# Patient Record
Sex: Female | Born: 1955 | Race: White | Hispanic: No | Smoking: Current every day smoker
Health system: Southern US, Community
[De-identification: ages and names within clinical notes are randomized; demographics above are authoritative.]

## PROBLEM LIST (undated history)

## (undated) DIAGNOSIS — S82209A Unspecified fracture of shaft of unspecified tibia, initial encounter for closed fracture: Secondary | ICD-10-CM

## (undated) DIAGNOSIS — J449 Chronic obstructive pulmonary disease, unspecified: Secondary | ICD-10-CM

## (undated) DIAGNOSIS — G5600 Carpal tunnel syndrome, unspecified upper limb: Secondary | ICD-10-CM

## (undated) DIAGNOSIS — J189 Pneumonia, unspecified organism: Secondary | ICD-10-CM

## (undated) DIAGNOSIS — Z8719 Personal history of other diseases of the digestive system: Secondary | ICD-10-CM

## (undated) DIAGNOSIS — M1990 Unspecified osteoarthritis, unspecified site: Secondary | ICD-10-CM

## (undated) DIAGNOSIS — D649 Anemia, unspecified: Secondary | ICD-10-CM

## (undated) DIAGNOSIS — K219 Gastro-esophageal reflux disease without esophagitis: Secondary | ICD-10-CM

## (undated) DIAGNOSIS — G43909 Migraine, unspecified, not intractable, without status migrainosus: Secondary | ICD-10-CM

## (undated) DIAGNOSIS — I10 Essential (primary) hypertension: Secondary | ICD-10-CM

## (undated) HISTORY — PX: UPPER GI ENDOSCOPY: SHX6162

## (undated) HISTORY — PX: ABDOMINAL HYSTERECTOMY: SHX81

## (undated) HISTORY — PX: COLONOSCOPY: SHX174

## (undated) HISTORY — PX: CARPAL TUNNEL RELEASE: SHX101

## (undated) HISTORY — PX: BACK SURGERY: SHX140

## (undated) HISTORY — PX: ORIF TIBIA & FIBULA FRACTURES: SHX2131

---

## 1984-03-05 HISTORY — PX: ECTOPIC PREGNANCY SURGERY: SHX613

## 2019-03-11 ENCOUNTER — Encounter: Payer: Self-pay | Admitting: Orthopaedic Surgery

## 2019-03-11 ENCOUNTER — Telehealth: Payer: Self-pay | Admitting: Orthopaedic Surgery

## 2019-03-11 ENCOUNTER — Ambulatory Visit (INDEPENDENT_AMBULATORY_CARE_PROVIDER_SITE_OTHER): Payer: Medicare HMO

## 2019-03-11 ENCOUNTER — Ambulatory Visit (INDEPENDENT_AMBULATORY_CARE_PROVIDER_SITE_OTHER): Payer: Medicare HMO | Admitting: Orthopaedic Surgery

## 2019-03-11 VITALS — Ht 60.0 in | Wt 118.0 lb

## 2019-03-11 DIAGNOSIS — S82202P Unspecified fracture of shaft of left tibia, subsequent encounter for closed fracture with malunion: Secondary | ICD-10-CM

## 2019-03-11 DIAGNOSIS — S82201P Unspecified fracture of shaft of right tibia, subsequent encounter for closed fracture with malunion: Secondary | ICD-10-CM | POA: Diagnosis not present

## 2019-03-11 DIAGNOSIS — M79661 Pain in right lower leg: Secondary | ICD-10-CM

## 2019-03-11 NOTE — Telephone Encounter (Signed)
I received 2 release forms from pt. they were not completed entirely and one was not dated. I mailed them to patient with highlighted areas that need to be filled in before can process.

## 2019-03-12 DIAGNOSIS — S82201P Unspecified fracture of shaft of right tibia, subsequent encounter for closed fracture with malunion: Secondary | ICD-10-CM | POA: Insufficient documentation

## 2019-03-12 DIAGNOSIS — S82202P Unspecified fracture of shaft of left tibia, subsequent encounter for closed fracture with malunion: Secondary | ICD-10-CM | POA: Insufficient documentation

## 2019-03-12 NOTE — Progress Notes (Signed)
Office Visit Note   Patient: Anne Carrillo           Date of Birth: [birth year]           MRN: [id number] Visit Date: 03/11/2019              Requested by: No referring provider defined for this encounter. PCP: Patient, No Pcp Per   Assessment & Plan: Visit Diagnoses:  1. Pain in right lower leg   2. Bilateral tibial fractures, closed, with malunion, subsequent encounter     Plan: Patient can return in 3 weeks we will need to get x-rays AP and lateral of the tibia and asked Dr. Lorin Mercy specifically about technique to make sure the 2 views are exactly 90degrees difference.  Discussion with patient that recommendation would be a dome osteotomy with fixation either repeat plating or external fixator application.  We discussed problems with plate removal potential for some shortening which he already has.  Questions were elicited and answered.  Repeat visit in 3 weeks for x-rays as above.  Follow-Up Instructions: Return in about 3 weeks (around 04/01/2019).   Orders:  Orders Placed This Encounter  Procedures  . XR Tibia/Fibula Right   No orders of the defined types were placed in this encounter.     Procedures: No procedures performed   Clinical Data: No additional findings.   Subjective: Chief Complaint  Patient presents with  . Right Leg - Pain    HPI 64 year old female has moved to this area has complex problem which is a right tibial nonunion originally fixed by Dr. Alphonzo Lemmings and orthopedist in [place].  She recently had been in [facility] and was treated by Dr. Cleda Clarks PCP.  Patient has a long-leg double upright brace with thigh attachment.  She walks with crutches.  She is supposed to have surgery in [place] at [facility] but states she has moved here.  Originally she stepped on a heating vent and broke her right tibia and surgery was done in [place].  She has been on tramadol in the past also gabapentin 3  times daily she takes losartan for hypertension.  Patient comes without records but states her PCP Dr.Dloomy had her orthopedic records from [place].  Patient currently lives alone after her initial fracture fixation she was in a skilled nursing home.  She states she was amatory at about 6 months after the surgery when she had increased pain and x-rays demonstrated a broken plate.  This appears to be a Synthes 13 hole plate by x-ray.  Review of Systems positive for tibial fracture with broken plate and malunion.  Positive for GERD, hypertension.   Objective: Vital Signs: Ht 5' (1.524 m)   Wt 118 lb (53.5 kg)   BMI 23.05 kg/m   Physical Exam Constitutional:      Appearance: She is well-developed.  HENT:     Head: Normocephalic.     Right Ear: External ear normal.     Left Ear: External ear normal.  Eyes:     Pupils: Pupils are equal, round, and reactive to light.  Neck:     Thyroid: No thyromegaly.     Trachea: No tracheal deviation.  Cardiovascular:     Rate and Rhythm: Normal rate.  Pulmonary:     Effort: Pulmonary effort is normal.  Abdominal:     Palpations: Abdomen is soft.  Skin:    General: Skin is warm and dry.  Neurological:  Mental Status: She is alert and oriented to person, place, and time.  Psychiatric:        Behavior: Behavior normal.     Ortho Exam patient has a right tibial nonunion with 40 degrees of varus in the proximal third of the tibia.  No motion at the previous fracture site.  Distal pulses palpable anterior tib gastrocsoleus is active.  No left tibial deformity.  Specialty Comments:  No specialty comments available.  Imaging: XR Tibia/Fibula Right  Result Date: 03/12/2019 AP lateral bleak x-rays right tibia is obtained.  This shows Synthes 13 hole plate broken in the proximal third with healed tib-fib fracture with 40 degrees of varus and 20 degrees of anterior bowing.  Fibular fracture more proximal is also healed with abundant callus  formation. Impression: Tibial nonunion with broken plate with deformity described above.    PMFS History: Patient Active Problem List   Diagnosis Date Noted  . Bilateral tibial fractures, closed, with malunion, subsequent encounter 03/12/2019   No past medical history on file.  No family history on file.  Past Surgical History:  Procedure Laterality Date  . right lower leg surgery     Social History   Occupational History  . Not on file  Tobacco Use  . Smoking status: Current Every Day Smoker  . Smokeless tobacco: Never Used  Substance and Sexual Activity  . Alcohol use: Not on file  . Drug use: Not on file  . Sexual activity: Not on file

## 2019-04-01 ENCOUNTER — Ambulatory Visit (INDEPENDENT_AMBULATORY_CARE_PROVIDER_SITE_OTHER): Payer: Medicare HMO

## 2019-04-01 ENCOUNTER — Ambulatory Visit: Payer: Medicare HMO | Admitting: Orthopaedic Surgery

## 2019-04-01 ENCOUNTER — Other Ambulatory Visit: Payer: Self-pay

## 2019-04-01 ENCOUNTER — Encounter: Payer: Self-pay | Admitting: Orthopaedic Surgery

## 2019-04-01 VITALS — Ht 60.0 in | Wt 118.0 lb

## 2019-04-01 DIAGNOSIS — T84116S Breakdown (mechanical) of internal fixation device of bone of right lower leg, sequela: Secondary | ICD-10-CM | POA: Diagnosis not present

## 2019-04-01 DIAGNOSIS — M79661 Pain in right lower leg: Secondary | ICD-10-CM

## 2019-04-01 DIAGNOSIS — T84418A Breakdown (mechanical) of other internal orthopedic devices, implants and grafts, initial encounter: Secondary | ICD-10-CM | POA: Insufficient documentation

## 2019-04-01 DIAGNOSIS — S82201P Unspecified fracture of shaft of right tibia, subsequent encounter for closed fracture with malunion: Secondary | ICD-10-CM

## 2019-04-01 DIAGNOSIS — S82202P Unspecified fracture of shaft of left tibia, subsequent encounter for closed fracture with malunion: Secondary | ICD-10-CM | POA: Diagnosis not present

## 2019-04-01 NOTE — Progress Notes (Signed)
Office Visit Note   Patient: Anne Carrillo           Date of Birth: [birth year]           MRN: [id number] Visit Date: 04/01/2019              Requested by: No referring provider defined for this encounter. PCP: Patient, No Pcp Per   Assessment & Plan: Visit Diagnoses:  1. Pain in right lower leg   2. Bilateral tibial fractures, closed, with malunion, subsequent encounter   3. Mechanical breakdown of internal fixation device of bone of right lower leg, sequela             Right tibial malunion with 26 degrees varus and 24 degrees anterior bow deformity.  Plan: Patient has a complex problem preventing her ambulation with a healed tibial malunion with broken 18-hole plate.  This is a lateral tibial plateau plate and she is healed with significant deformity.  She will require removal of the right tibial plateau plate, tibial osteotomy through the malunion.  On 2 views of her knee 90 degrees apart deformity very similar 24 to 26 degrees so she will need a anterolateral osteotomy shaped like a smile with correction of the deformity and repeat plate fixation.  We discussed risks of malunion risk of infection with osteomyelitis, reoperation.  We discussed possibly using double plate fixation for additional stability since she previously walked after for surgery and broke the plate.  Questions elicited and answered.  She lives alone and would need skilled nursing facility postoperatively since she does not have anyone to care for her.  She has hypertension and GERD and needs to find some local primary medical care.  Outlined procedure discussed.  Questions were elicited and answered she understands  And request to proceed.  We discussed recommendations quit smoking to improve her chances of having successful healing.  She is a long-term smoker. Decision for surgery made at todays visit.   Follow-Up Instructions: return in 2 wks . If she gets primary care coverage and is medically cleared for surgery then  surgery will be scheduled.   Orders:  Orders Placed This Encounter  Procedures  . XR Tibia/Fibula Right   No orders of the defined types were placed in this encounter.     Procedures: No procedures performed   Clinical Data: No additional findings.   Subjective: Chief Complaint  Patient presents with  . Right Leg - Pain    HPI 64 year old female returns with right proximal third tib-fib nonunion and broken plate healed and significant varus and anterior bowing.  Original surgery done by Dr. Alphonzo Lemmings and orthopedist in [place].  She had seen another orthopedist in [facility] before she moved to [place] which was Dr. Cleda Clarks who had discussed osteotomy correction of her significant tibial nonunion deformity.  She is walking a long-leg brace has persistent pain problems.  Originally she stepped on a heating vent and broke her right tibia and surgery was done in [place].  She has been on tramadol gabapentin for the pain.  She uses losartan for hypertension but otherwise has been healthy active and ambulates with crutches.  Patient has a Synthes 13 hole plate which broke in the postoperative period and healed with cast formation and significant varus.  Review of Systems positive for GERD, hypertension, right tibial malunion.    Objective: Vital Signs: Ht 5' (1.524 m)   Wt 118 lb (53.5 kg)   BMI 23.05 kg/m  Physical Exam Constitutional:      Appearance: She is well-developed.  HENT:     Head: Normocephalic.     Right Ear: External ear normal.     Left Ear: External ear normal.  Eyes:     Pupils: Pupils are equal, round, and reactive to light.  Neck:     Thyroid: No thyromegaly.     Trachea: No tracheal deviation.  Cardiovascular:     Rate and Rhythm: Normal rate.  Pulmonary:     Effort: Pulmonary effort is normal.  Abdominal:     Palpations: Abdomen is soft.  Skin:    General: Skin is warm and dry.  Neurological:      Mental Status: She is alert and oriented to person, place, and time.  Psychiatric:        Behavior: Behavior normal.     Ortho Exam various anterior bowing noted proximal third of the right tibia with prominent palpable anterior screws.  Skin is intact no areas of drainage pulses palpable she has ankle dorsiflexion plantarflexion and palpable pulses.  Left tibia shows good position alignment.  Specialty Comments:  No specialty comments available.  Imaging: XR Tibia/Fibula Right  Result Date: 04/01/2019 AP and lateral tib-fib x-rays obtained multiple views to get a perfect AP and lateral of the knee.  This shows tib-fib malunion with involvement of the proximal third of the tibial shaft with 26 degrees varus and 24 degrees anterior bowing.  Abundant callus formation and complete healing of the fracture with malunion is noted.  18 hole broken plate is noted. Impression: Right tibial malunion with plate fracture and significant deformity described above.    PMFS History: Patient Active Problem List   Diagnosis Date Noted  . Mechanical breakdown of internal orthopedic device (HCC) 04/01/2019  . Bilateral tibial fractures, closed, with malunion, subsequent encounter 03/12/2019   No past medical history on file.  No family history on file.  Past Surgical History:  Procedure Laterality Date  . right lower leg surgery     Social History   Occupational History  . Not on file  Tobacco Use  . Smoking status: Current Every Day Smoker  . Smokeless tobacco: Never Used  Substance and Sexual Activity  . Alcohol use: Not on file  . Drug use: Not on file  . Sexual activity: Not on file

## 2019-04-01 NOTE — Progress Notes (Deleted)
   Post-Op Visit Note   Patient: Anne Carrillo           Date of Birth: 12-23-1955           MRN: [id number] Visit Date: 04/01/2019 PCP: Patient, No Pcp Per   Assessment & Plan:  Chief Complaint:  Chief Complaint  Patient presents with  . Right Leg - Pain   Visit Diagnoses:  1. Pain in right lower leg     Plan: ***  Follow-Up Instructions: Return in about 2 weeks (around 04/15/2019).   Orders:  Orders Placed This Encounter  Procedures  . XR Tibia/Fibula Right   No orders of the defined types were placed in this encounter.   Imaging: No results found.  PMFS History: Patient Active Problem List   Diagnosis Date Noted  . Bilateral tibial fractures, closed, with malunion, subsequent encounter 03/12/2019   No past medical history on file.  No family history on file.  Past Surgical History:  Procedure Laterality Date  . right lower leg surgery     Social History   Occupational History  . Not on file  Tobacco Use  . Smoking status: Current Every Day Smoker  . Smokeless tobacco: Never Used  Substance and Sexual Activity  . Alcohol use: Not on file  . Drug use: Not on file  . Sexual activity: Not on file

## 2019-05-04 ENCOUNTER — Telehealth: Payer: Self-pay | Admitting: Orthopaedic Surgery

## 2019-05-04 NOTE — Telephone Encounter (Signed)
Patient called requesting Dr. Ophelia Charter contact patient primary Dr. Burton Apley. Phone number [phone]. Patient stated Dr. Su Hilt is waiting for results so patient can be scheduled for surgery. Patient phone number is [phone].

## 2019-05-04 NOTE — Telephone Encounter (Signed)
Were you trying to obtain clearance from her PCP?

## 2019-05-06 ENCOUNTER — Telehealth: Payer: Self-pay | Admitting: Orthopaedic Surgery

## 2019-05-06 NOTE — Telephone Encounter (Signed)
She has HTN needs Tx and once PCP feels she is OK for surgery then we can schedule. Justing letting a PCP lay eyes on her does not fix HTN and GERD etc. thanks

## 2019-05-06 NOTE — Telephone Encounter (Signed)
Patient called requesting a call back from Dr. Ophelia Charter. Patient states her PCP is waiting for a call from Dr. Ophelia Charter about test results ans moving forward with a surgery date. Patient phone number is [phone]

## 2019-05-06 NOTE — Telephone Encounter (Signed)
Per Anne Carrillo, patient has called her with PCP.  [name] does not have blue sheet. Per last office note, you were going to have patient return to the office in two weeks, after she established a PCP, however, surgery was decided upon that day.  Would you like for me to bring patient back in to the office prior to completing blue sheet and [name] reaching out for clearance?

## 2019-05-13 NOTE — Telephone Encounter (Signed)
Have you spoken with patient in regards to surgery and Dr. Ophelia Charter message? I tried to reach her with no answer. If not, please send back to me and I will continue to try and reach her. Thanks.

## 2019-05-18 NOTE — Telephone Encounter (Signed)
I don't have a surgery sheet on this patient.  I can send a clearance to the PCP if I know exactly what procedure she is having.  I saw the note from Dr. Ophelia Charter on 05-06-19.  It seems from his note there are other  health issues that need to be addressed prior to scheduling.

## 2019-05-19 NOTE — Telephone Encounter (Signed)
Yes sounds good

## 2019-07-23 ENCOUNTER — Other Ambulatory Visit: Payer: Self-pay | Admitting: Gastroenterology

## 2019-07-23 DIAGNOSIS — Z789 Other specified health status: Secondary | ICD-10-CM

## 2019-07-23 DIAGNOSIS — Z7289 Other problems related to lifestyle: Secondary | ICD-10-CM

## 2019-07-23 DIAGNOSIS — F1090 Alcohol use, unspecified, uncomplicated: Secondary | ICD-10-CM

## 2019-07-28 ENCOUNTER — Ambulatory Visit
Admission: RE | Admit: 2019-07-28 | Discharge: 2019-07-28 | Disposition: A | Discharge status: Home / Self Care | Payer: Medicare HMO | Source: Ambulatory Visit | Attending: Gastroenterology | Admitting: Gastroenterology

## 2019-07-28 DIAGNOSIS — Z7289 Other problems related to lifestyle: Secondary | ICD-10-CM

## 2019-07-28 DIAGNOSIS — Z789 Other specified health status: Secondary | ICD-10-CM

## 2019-09-14 ENCOUNTER — Telehealth: Payer: Self-pay | Admitting: Orthopaedic Surgery

## 2019-09-14 NOTE — Telephone Encounter (Signed)
See below. Please advise. Thanks.  

## 2019-09-14 NOTE — Telephone Encounter (Signed)
removal of the right tibial plateau plate, tibial osteotomy through the    Patient called wants to move forward with surgery.  Her last visit shows 04-01-19 and instructions to follow up 2 weeks later.  Patient is now established with PCP-Dr. Burton Apley in [place]. Patient said she saw him just a few weeks ago.  She did mention that she has cut back on her smoking.   Last dictation describes plan as removal of the right tibial plateau plate, tibial osteotomy through the malunion.   Can I please have surgery sheet with codes?

## 2019-09-14 NOTE — Telephone Encounter (Signed)
Needs to quit smoking before surgery so maximize chance that leg will heal and be straight. Will need pre-op visit with Zonia Kief several weeks after she quits to be sure she is not smoking . Blue sheet done .

## 2019-09-17 ENCOUNTER — Telehealth: Payer: Self-pay | Admitting: Orthopaedic Surgery

## 2019-09-23 ENCOUNTER — Other Ambulatory Visit: Payer: Self-pay

## 2019-10-07 ENCOUNTER — Ambulatory Visit (INDEPENDENT_AMBULATORY_CARE_PROVIDER_SITE_OTHER): Payer: Medicare HMO | Admitting: Surgery

## 2019-10-07 ENCOUNTER — Encounter: Payer: Self-pay | Admitting: Surgery

## 2019-10-07 VITALS — BP 115/87 | HR 115 | Ht <= 58 in | Wt 133.6 lb

## 2019-10-07 DIAGNOSIS — T84116S Breakdown (mechanical) of internal fixation device of bone of right lower leg, sequela: Secondary | ICD-10-CM

## 2019-10-07 NOTE — Progress Notes (Signed)
64 year old white female with history of right tibial nonunion and hardware failure from previous ORIF comes in for preop evaluation.  Continues to have ongoing issues with her leg.  Patient wants to proceed with right tibia fibula osteotomy, takedown malunion, replating.  Surgical procedure discussed.  Today history and physical performed.  Review of systems negative.  Patient states "I just want to get this thing fixed".  All questions answered.

## 2019-10-10 ENCOUNTER — Other Ambulatory Visit (HOSPITAL_COMMUNITY)
Admission: RE | Admit: 2019-10-10 | Discharge: 2019-10-10 | Disposition: A | Discharge status: Home / Self Care | Payer: Medicare Other | Source: Ambulatory Visit | Attending: Orthopaedic Surgery | Admitting: Orthopaedic Surgery

## 2019-10-10 DIAGNOSIS — Z01812 Encounter for preprocedural laboratory examination: Secondary | ICD-10-CM | POA: Insufficient documentation

## 2019-10-10 DIAGNOSIS — Z20822 Contact with and (suspected) exposure to covid-19: Secondary | ICD-10-CM | POA: Insufficient documentation

## 2019-10-10 LAB — SARS CORONAVIRUS 2 (TAT 6-24 HRS): SARS Coronavirus 2: NEGATIVE

## 2019-10-13 ENCOUNTER — Other Ambulatory Visit: Payer: Self-pay

## 2019-10-13 ENCOUNTER — Encounter (HOSPITAL_COMMUNITY): Payer: Self-pay | Admitting: Orthopaedic Surgery

## 2019-10-13 NOTE — Progress Notes (Addendum)
COVID Vaccine Completed: No Date COVID Vaccine completed:N/A COVID vaccine manufacturer: N/A  PCP - Dr. Burton Apley Cardiologist - N/A  Chest x-ray - N/A EKG - N/A Stress Test - N/A ECHO - N/A Cardiac Cath - N/A  Sleep Study - N/A CPAP - N/A  Fasting Blood Sugar - N/A Checks Blood Sugar _N/A____ times a day  Blood Thinner Instructions: N/A Aspirin Instructions: N/A Last Dose:N/A   Anesthesia review: N/A  Patient denies shortness of breath, fever, cough and chest pain at PAT appointment   Patient verbalized understanding of instructions that were given to them at the PAT appointment. Patient was also instructed that they will need to review over the PAT instructions again at home before surgery.

## 2019-10-13 NOTE — Anesthesia Preprocedure Evaluation (Addendum)
Anesthesia Evaluation  Patient identified by MRN, date of birth, ID band Patient awake    Reviewed: Allergy & Precautions, NPO status , Patient's Chart, lab work & pertinent test results  Airway Mallampati: II  TM Distance: >3 FB Neck ROM: Full    Dental  (+) Dental Advisory Given   Pulmonary pneumonia, COPD, Current Smoker,    Pulmonary exam normal breath sounds clear to auscultation       Cardiovascular hypertension, Pt. on medications Normal cardiovascular exam Rhythm:Regular Rate:Normal     Neuro/Psych  Headaches,    GI/Hepatic Neg liver ROS, hiatal hernia, GERD  ,  Endo/Other  negative endocrine ROS  Renal/GU negative Renal ROS     Musculoskeletal  (+) Arthritis ,   Abdominal   Peds  Hematology  (+) Blood dyscrasia, anemia ,   Anesthesia Other Findings   Reproductive/Obstetrics                            Anesthesia Physical Anesthesia Plan  ASA: III  Anesthesia Plan: General   Post-op Pain Management:  Regional for Post-op pain   Induction: Intravenous  PONV Risk Score and Plan: 2 and Ondansetron, Dexamethasone and Treatment may vary due to age or medical condition  Airway Management Planned: Oral ETT  Additional Equipment:   Intra-op Plan:   Post-operative Plan: Extubation in OR  Informed Consent: I have reviewed the patients History and Physical, chart, labs and discussed the procedure including the risks, benefits and alternatives for the proposed anesthesia with the patient or authorized representative who has indicated his/her understanding and acceptance.     Dental advisory given  Plan Discussed with: CRNA  Anesthesia Plan Comments:       Anesthesia Quick Evaluation

## 2019-10-14 ENCOUNTER — Ambulatory Visit (HOSPITAL_COMMUNITY): Payer: Medicare Other

## 2019-10-14 ENCOUNTER — Ambulatory Visit (HOSPITAL_COMMUNITY): Payer: Medicare Other | Admitting: Certified Registered"

## 2019-10-14 ENCOUNTER — Encounter (HOSPITAL_COMMUNITY)
Admission: AD | Disposition: A | Discharge status: Skilled Nursing Facility | Payer: Self-pay | Source: Home / Self Care | Attending: Orthopaedic Surgery

## 2019-10-14 ENCOUNTER — Encounter (HOSPITAL_COMMUNITY): Payer: Self-pay | Admitting: Orthopaedic Surgery

## 2019-10-14 ENCOUNTER — Inpatient Hospital Stay (HOSPITAL_COMMUNITY)
Admission: AD | Admit: 2019-10-14 | Discharge: 2019-10-19 | DRG: 493 | Disposition: A | Discharge status: Skilled Nursing Facility | Payer: Medicare Other | Attending: Orthopaedic Surgery | Admitting: Orthopaedic Surgery

## 2019-10-14 DIAGNOSIS — F17210 Nicotine dependence, cigarettes, uncomplicated: Secondary | ICD-10-CM | POA: Diagnosis present

## 2019-10-14 DIAGNOSIS — S82201P Unspecified fracture of shaft of right tibia, subsequent encounter for closed fracture with malunion: Secondary | ICD-10-CM

## 2019-10-14 DIAGNOSIS — Y792 Prosthetic and other implants, materials and accessory orthopedic devices associated with adverse incidents: Secondary | ICD-10-CM | POA: Diagnosis present

## 2019-10-14 DIAGNOSIS — T84196A Other mechanical complication of internal fixation device of bone of right lower leg, initial encounter: Principal | ICD-10-CM | POA: Diagnosis present

## 2019-10-14 DIAGNOSIS — J449 Chronic obstructive pulmonary disease, unspecified: Secondary | ICD-10-CM | POA: Diagnosis present

## 2019-10-14 DIAGNOSIS — D62 Acute posthemorrhagic anemia: Secondary | ICD-10-CM | POA: Diagnosis not present

## 2019-10-14 DIAGNOSIS — T84116S Breakdown (mechanical) of internal fixation device of bone of right lower leg, sequela: Secondary | ICD-10-CM | POA: Diagnosis not present

## 2019-10-14 DIAGNOSIS — Z419 Encounter for procedure for purposes other than remedying health state, unspecified: Secondary | ICD-10-CM

## 2019-10-14 DIAGNOSIS — Z20822 Contact with and (suspected) exposure to covid-19: Secondary | ICD-10-CM | POA: Diagnosis present

## 2019-10-14 DIAGNOSIS — S82101P Unspecified fracture of upper end of right tibia, subsequent encounter for closed fracture with malunion: Secondary | ICD-10-CM | POA: Diagnosis not present

## 2019-10-14 DIAGNOSIS — Z90710 Acquired absence of both cervix and uterus: Secondary | ICD-10-CM | POA: Diagnosis not present

## 2019-10-14 DIAGNOSIS — T84418A Breakdown (mechanical) of other internal orthopedic devices, implants and grafts, initial encounter: Secondary | ICD-10-CM | POA: Diagnosis present

## 2019-10-14 DIAGNOSIS — I10 Essential (primary) hypertension: Secondary | ICD-10-CM | POA: Diagnosis present

## 2019-10-14 DIAGNOSIS — K219 Gastro-esophageal reflux disease without esophagitis: Secondary | ICD-10-CM | POA: Diagnosis present

## 2019-10-14 DIAGNOSIS — S82401P Unspecified fracture of shaft of right fibula, subsequent encounter for closed fracture with malunion: Secondary | ICD-10-CM | POA: Diagnosis not present

## 2019-10-14 HISTORY — DX: Pneumonia, unspecified organism: J18.9

## 2019-10-14 HISTORY — DX: Migraine, unspecified, not intractable, without status migrainosus: G43.909

## 2019-10-14 HISTORY — DX: Carpal tunnel syndrome, unspecified upper limb: G56.00

## 2019-10-14 HISTORY — DX: Anemia, unspecified: D64.9

## 2019-10-14 HISTORY — DX: Unspecified fracture of shaft of unspecified tibia, initial encounter for closed fracture: S82.209A

## 2019-10-14 HISTORY — DX: Chronic obstructive pulmonary disease, unspecified: J44.9

## 2019-10-14 HISTORY — DX: Essential (primary) hypertension: I10

## 2019-10-14 HISTORY — DX: Personal history of other diseases of the digestive system: Z87.19

## 2019-10-14 HISTORY — PX: TIBIA OSTEOTOMY: SHX1065

## 2019-10-14 HISTORY — DX: Gastro-esophageal reflux disease without esophagitis: K21.9

## 2019-10-14 HISTORY — DX: Unspecified osteoarthritis, unspecified site: M19.90

## 2019-10-14 LAB — COMPREHENSIVE METABOLIC PANEL
ALT: 11 U/L (ref 0–44)
AST: 18 U/L (ref 15–41)
Albumin: 3.6 g/dL (ref 3.5–5.0)
Alkaline Phosphatase: 70 U/L (ref 38–126)
Anion gap: 8 (ref 5–15)
BUN: 14 mg/dL (ref 8–23)
CO2: 21 mmol/L — ABNORMAL LOW (ref 22–32)
Calcium: 8.5 mg/dL — ABNORMAL LOW (ref 8.9–10.3)
Chloride: 105 mmol/L (ref 98–111)
Creatinine, Ser: 0.89 mg/dL (ref 0.44–1.00)
GFR calc Af Amer: >60 mL/min (ref >60)
GFR calc non Af Amer: >60 mL/min (ref >60)
Glucose, Bld: 81 mg/dL (ref 70–99)
Potassium: 4.1 mmol/L (ref 3.5–5.1)
Sodium: 134 mmol/L — ABNORMAL LOW (ref 135–145)
Total Bilirubin: 0.2 mg/dL — ABNORMAL LOW (ref 0.3–1.2)
Total Protein: 7.1 g/dL (ref 6.5–8.1)

## 2019-10-14 LAB — CBC
HCT: 26.6 % — ABNORMAL LOW (ref 36.0–46.0)
Hemoglobin: 8.1 g/dL — ABNORMAL LOW (ref 12.0–15.0)
MCH: 23.5 pg — ABNORMAL LOW (ref 26.0–34.0)
MCHC: 30.5 g/dL (ref 30.0–36.0)
MCV: 77.3 fL — ABNORMAL LOW (ref 80.0–100.0)
Platelets: 350 10*3/uL (ref 150–400)
RBC: 3.44 MIL/uL — ABNORMAL LOW (ref 3.87–5.11)
RDW: 17.2 % — ABNORMAL HIGH (ref 11.5–15.5)
WBC: 6.6 10*3/uL (ref 4.0–10.5)
nRBC: 0 % (ref 0.0–0.2)

## 2019-10-14 LAB — ABO/RH: ABO/RH(D): A POS

## 2019-10-14 SURGERY — OSTEOTOMY, TIBIA
Anesthesia: General | Site: Leg Lower | Laterality: Right

## 2019-10-14 MED ORDER — GABAPENTIN 300 MG PO CAPS
600.0000 mg | ORAL_CAPSULE | Freq: Three times a day (TID) | ORAL | Status: DC
  Administered 2019-10-14 – 2019-10-19 (×15): 600 mg via ORAL
  Filled 2019-10-14 (×15): qty 2

## 2019-10-14 MED ORDER — MIDAZOLAM HCL 2 MG/2ML IJ SOLN
INTRAMUSCULAR | Status: AC
  Filled 2019-10-14: qty 2

## 2019-10-14 MED ORDER — PHENYLEPHRINE HCL (PRESSORS) 10 MG/ML IV SOLN
INTRAVENOUS | Status: DC | PRN
Start: 2019-10-14 — End: 2019-10-14
  Administered 2019-10-14: 100 ug via INTRAVENOUS
  Administered 2019-10-14 (×2): 120 ug via INTRAVENOUS
  Administered 2019-10-14: 160 ug via INTRAVENOUS
  Administered 2019-10-14 (×2): 80 ug via INTRAVENOUS

## 2019-10-14 MED ORDER — ROCURONIUM BROMIDE 10 MG/ML (PF) SYRINGE
PREFILLED_SYRINGE | INTRAVENOUS | Status: AC
  Filled 2019-10-14: qty 10

## 2019-10-14 MED ORDER — LOSARTAN POTASSIUM 25 MG PO TABS
25.0000 mg | ORAL_TABLET | Freq: Every evening | ORAL | Status: DC
  Administered 2019-10-14 – 2019-10-19 (×4): 25 mg via ORAL
  Filled 2019-10-14 (×6): qty 1

## 2019-10-14 MED ORDER — DEXAMETHASONE SODIUM PHOSPHATE 4 MG/ML IJ SOLN
INTRAMUSCULAR | Status: DC | PRN
  Administered 2019-10-14: 4 mg via PERINEURAL
  Administered 2019-10-14: 3 mg via PERINEURAL
  Administered 2019-10-14: 8 mg via PERINEURAL

## 2019-10-14 MED ORDER — PANTOPRAZOLE SODIUM 40 MG PO TBEC
40.0000 mg | DELAYED_RELEASE_TABLET | Freq: Every day | ORAL | Status: DC
  Administered 2019-10-14 – 2019-10-19 (×6): 40 mg via ORAL
  Filled 2019-10-14 (×5): qty 1

## 2019-10-14 MED ORDER — PHENYLEPHRINE HCL (PRESSORS) 10 MG/ML IV SOLN
INTRAVENOUS | Status: AC
  Filled 2019-10-14: qty 1

## 2019-10-14 MED ORDER — CLONIDINE HCL (ANALGESIA) 100 MCG/ML EP SOLN
EPIDURAL | Status: DC | PRN
  Administered 2019-10-14: 30 ug
  Administered 2019-10-14: 50 ug

## 2019-10-14 MED ORDER — METOCLOPRAMIDE HCL 5 MG/ML IJ SOLN: 5.0000 mg | Freq: Three times a day (TID) | INTRAMUSCULAR | Status: DC | PRN

## 2019-10-14 MED ORDER — CEFAZOLIN SODIUM-DEXTROSE 2-4 GM/100ML-% IV SOLN
2.0000 g | INTRAVENOUS | Status: AC
  Administered 2019-10-14: 2 g via INTRAVENOUS
  Filled 2019-10-14: qty 100

## 2019-10-14 MED ORDER — FENTANYL CITRATE (PF) 100 MCG/2ML IJ SOLN
INTRAMUSCULAR | Status: DC | PRN
  Administered 2019-10-14: 25 ug via INTRAVENOUS
  Administered 2019-10-14: 50 ug via INTRAVENOUS
  Administered 2019-10-14: 25 ug via INTRAVENOUS
  Administered 2019-10-14: 50 ug via INTRAVENOUS
  Administered 2019-10-14 (×4): 25 ug via INTRAVENOUS

## 2019-10-14 MED ORDER — ONDANSETRON HCL 4 MG/2ML IJ SOLN
INTRAMUSCULAR | Status: DC | PRN
  Administered 2019-10-14: 4 mg via INTRAVENOUS

## 2019-10-14 MED ORDER — ALBUMIN HUMAN 5 % IV SOLN: INTRAVENOUS | Status: DC | PRN

## 2019-10-14 MED ORDER — PROPOFOL 10 MG/ML IV BOLUS
INTRAVENOUS | Status: AC
  Filled 2019-10-14: qty 20

## 2019-10-14 MED ORDER — ACETAMINOPHEN 325 MG PO TABS
325.0000 mg | ORAL_TABLET | Freq: Four times a day (QID) | ORAL | Status: DC | PRN
  Administered 2019-10-15 – 2019-10-19 (×6): 650 mg via ORAL
  Filled 2019-10-14 (×6): qty 2

## 2019-10-14 MED ORDER — PHENYLEPHRINE 40 MCG/ML (10ML) SYRINGE FOR IV PUSH (FOR BLOOD PRESSURE SUPPORT)
PREFILLED_SYRINGE | INTRAVENOUS | Status: AC
  Filled 2019-10-14: qty 10

## 2019-10-14 MED ORDER — 0.9 % SODIUM CHLORIDE (POUR BTL) OPTIME
TOPICAL | Status: DC | PRN
  Administered 2019-10-14: 1000 mL

## 2019-10-14 MED ORDER — EPHEDRINE 5 MG/ML INJ
INTRAVENOUS | Status: AC
  Filled 2019-10-14: qty 10

## 2019-10-14 MED ORDER — OXYCODONE HCL 5 MG PO TABS
5.0000 mg | ORAL_TABLET | ORAL | Status: DC | PRN
  Administered 2019-10-14 – 2019-10-19 (×23): 5 mg via ORAL
  Filled 2019-10-14 (×23): qty 1

## 2019-10-14 MED ORDER — PHENYLEPHRINE HCL-NACL 10-0.9 MG/250ML-% IV SOLN
INTRAVENOUS | Status: DC | PRN
  Administered 2019-10-14: 15 ug/min via INTRAVENOUS

## 2019-10-14 MED ORDER — LIDOCAINE 2% (20 MG/ML) 5 ML SYRINGE
INTRAMUSCULAR | Status: DC | PRN
  Administered 2019-10-14: 80 mg via INTRAVENOUS

## 2019-10-14 MED ORDER — PROPOFOL 10 MG/ML IV BOLUS
INTRAVENOUS | Status: DC | PRN
  Administered 2019-10-14: 25 mg via INTRAVENOUS
  Administered 2019-10-14: 70 mg via INTRAVENOUS

## 2019-10-14 MED ORDER — MIDAZOLAM HCL 2 MG/2ML IJ SOLN
1.0000 mg | INTRAMUSCULAR | Status: DC
  Administered 2019-10-14: 2 mg via INTRAVENOUS
  Filled 2019-10-14: qty 2

## 2019-10-14 MED ORDER — LIDOCAINE 2% (20 MG/ML) 5 ML SYRINGE
INTRAMUSCULAR | Status: AC
  Filled 2019-10-14: qty 5

## 2019-10-14 MED ORDER — PHENYLEPHRINE 40 MCG/ML (10ML) SYRINGE FOR IV PUSH (FOR BLOOD PRESSURE SUPPORT)
PREFILLED_SYRINGE | INTRAVENOUS | Status: DC | PRN
  Administered 2019-10-14: 120 ug via INTRAVENOUS
  Administered 2019-10-14: 80 ug via INTRAVENOUS
  Administered 2019-10-14: 120 ug via INTRAVENOUS
  Administered 2019-10-14: 80 ug via INTRAVENOUS

## 2019-10-14 MED ORDER — ONDANSETRON HCL 4 MG PO TABS
4.0000 mg | ORAL_TABLET | Freq: Four times a day (QID) | ORAL | Status: DC | PRN
  Administered 2019-10-16 – 2019-10-17 (×2): 4 mg via ORAL
  Filled 2019-10-14 (×2): qty 1

## 2019-10-14 MED ORDER — ASPIRIN 325 MG PO TABS
325.0000 mg | ORAL_TABLET | Freq: Every day | ORAL | Status: DC
  Administered 2019-10-15 – 2019-10-19 (×5): 325 mg via ORAL
  Filled 2019-10-14 (×5): qty 1

## 2019-10-14 MED ORDER — ROCURONIUM BROMIDE 10 MG/ML (PF) SYRINGE
PREFILLED_SYRINGE | INTRAVENOUS | Status: DC | PRN
  Administered 2019-10-14: 40 mg via INTRAVENOUS
  Administered 2019-10-14 (×3): 10 mg via INTRAVENOUS

## 2019-10-14 MED ORDER — HYDROMORPHONE HCL 1 MG/ML IJ SOLN
0.5000 mg | INTRAMUSCULAR | Status: DC | PRN
  Administered 2019-10-16 – 2019-10-19 (×7): 0.5 mg via INTRAVENOUS
  Filled 2019-10-14 (×7): qty 0.5

## 2019-10-14 MED ORDER — BUPIVACAINE-EPINEPHRINE (PF) 0.5% -1:200000 IJ SOLN
INTRAMUSCULAR | Status: DC | PRN
  Administered 2019-10-14: 20 mL via PERINEURAL
  Administered 2019-10-14: 30 mL via PERINEURAL

## 2019-10-14 MED ORDER — HYDROMORPHONE HCL 1 MG/ML IJ SOLN: 0.2500 mg | INTRAMUSCULAR | Status: DC | PRN

## 2019-10-14 MED ORDER — POLYETHYLENE GLYCOL 3350 17 G PO PACK
17.0000 g | PACK | Freq: Every day | ORAL | Status: DC | PRN
  Administered 2019-10-17: 17 g via ORAL
  Filled 2019-10-14: qty 1

## 2019-10-14 MED ORDER — DOCUSATE SODIUM 100 MG PO CAPS
100.0000 mg | ORAL_CAPSULE | Freq: Two times a day (BID) | ORAL | Status: DC
  Administered 2019-10-14 – 2019-10-19 (×10): 100 mg via ORAL
  Filled 2019-10-14 (×10): qty 1

## 2019-10-14 MED ORDER — FENTANYL CITRATE (PF) 100 MCG/2ML IJ SOLN
50.0000 ug | Freq: Once | INTRAMUSCULAR | Status: AC
  Administered 2019-10-14: 50 ug via INTRAVENOUS
  Filled 2019-10-14: qty 2

## 2019-10-14 MED ORDER — FENTANYL CITRATE (PF) 250 MCG/5ML IJ SOLN
INTRAMUSCULAR | Status: AC
  Filled 2019-10-14: qty 5

## 2019-10-14 MED ORDER — DEXAMETHASONE SODIUM PHOSPHATE 10 MG/ML IJ SOLN
INTRAMUSCULAR | Status: AC
  Filled 2019-10-14: qty 1

## 2019-10-14 MED ORDER — SUGAMMADEX SODIUM 200 MG/2ML IV SOLN
INTRAVENOUS | Status: DC | PRN
  Administered 2019-10-14: 200 mg via INTRAVENOUS

## 2019-10-14 MED ORDER — MEPERIDINE HCL 50 MG/ML IJ SOLN: 6.2500 mg | INTRAMUSCULAR | Status: DC | PRN

## 2019-10-14 MED ORDER — METOCLOPRAMIDE HCL 5 MG PO TABS: 5.0000 mg | ORAL_TABLET | Freq: Three times a day (TID) | ORAL | Status: DC | PRN

## 2019-10-14 MED ORDER — LACTATED RINGERS IV SOLN: INTRAVENOUS | Status: DC

## 2019-10-14 MED ORDER — ONDANSETRON HCL 4 MG/2ML IJ SOLN: 4.0000 mg | Freq: Once | INTRAMUSCULAR | Status: DC | PRN

## 2019-10-14 MED ORDER — ONDANSETRON HCL 4 MG/2ML IJ SOLN: 4.0000 mg | Freq: Four times a day (QID) | INTRAMUSCULAR | Status: DC | PRN

## 2019-10-14 MED ORDER — ONDANSETRON HCL 4 MG/2ML IJ SOLN
INTRAMUSCULAR | Status: AC
  Filled 2019-10-14: qty 2

## 2019-10-14 MED ORDER — SODIUM CHLORIDE 0.9 % IV SOLN: INTRAVENOUS | Status: DC

## 2019-10-14 MED ORDER — MIDAZOLAM HCL 5 MG/5ML IJ SOLN
INTRAMUSCULAR | Status: DC | PRN
  Administered 2019-10-14: 1 mg via INTRAVENOUS

## 2019-10-14 SURGICAL SUPPLY — 58 items
BAG SPEC THK2 15X12 ZIP CLS (MISCELLANEOUS) ×1
BAG ZIPLOCK 12X15 (MISCELLANEOUS) ×2 IMPLANT
BANDAGE ESMARK 6X9 LF (GAUZE/BANDAGES/DRESSINGS) ×1 IMPLANT
BIT DRILL 2.5X2.75 QC CALB (BIT) ×2 IMPLANT
BIT DRILL CALIBRATED 2.7 (BIT) ×2 IMPLANT
BIT DRILL QC 3.3X195 (BIT) ×2 IMPLANT
BNDG CMPR 9X6 STRL LF SNTH (GAUZE/BANDAGES/DRESSINGS) ×1
BNDG ELASTIC 4X5.8 VLCR STR LF (GAUZE/BANDAGES/DRESSINGS) ×2 IMPLANT
BNDG ELASTIC 6X5.8 VLCR STR LF (GAUZE/BANDAGES/DRESSINGS) ×2 IMPLANT
BNDG ESMARK 6X9 LF (GAUZE/BANDAGES/DRESSINGS) ×2
CAP LOCK NCB (Cap) ×22 IMPLANT
COVER SURGICAL LIGHT HANDLE (MISCELLANEOUS) ×2 IMPLANT
COVER WAND RF STERILE (DRAPES) IMPLANT
CUFF TOURN SGL QUICK 34 (TOURNIQUET CUFF) ×2
CUFF TRNQT CYL 34X4.125X (TOURNIQUET CUFF) ×1 IMPLANT
DECANTER SPIKE VIAL GLASS SM (MISCELLANEOUS) IMPLANT
DRAPE C-ARM 42X120 X-RAY (DRAPES) ×2 IMPLANT
DRAPE C-ARMOR (DRAPES) ×2 IMPLANT
DRAPE EXTREMITY T 121X128X90 (DISPOSABLE) ×2 IMPLANT
DRAPE U-SHAPE 47X51 STRL (DRAPES) ×2 IMPLANT
DRSG ADAPTIC 3X8 NADH LF (GAUZE/BANDAGES/DRESSINGS) ×2 IMPLANT
DRSG PAD ABDOMINAL 8X10 ST (GAUZE/BANDAGES/DRESSINGS) ×2 IMPLANT
DURAPREP 26ML APPLICATOR (WOUND CARE) ×6 IMPLANT
ELECT REM PT RETURN 15FT ADLT (MISCELLANEOUS) ×2 IMPLANT
GAUZE SPONGE 4X4 12PLY STRL (GAUZE/BANDAGES/DRESSINGS) ×2 IMPLANT
GAUZE XEROFORM 5X9 LF (GAUZE/BANDAGES/DRESSINGS) ×2 IMPLANT
GLOVE BIOGEL PI IND STRL 8 (GLOVE) ×2 IMPLANT
GLOVE BIOGEL PI INDICATOR 8 (GLOVE) ×2
GLOVE ORTHO TXT STRL SZ7.5 (GLOVE) ×4 IMPLANT
GOWN STRL REUS W/TWL LRG LVL3 (GOWN DISPOSABLE) ×2 IMPLANT
IMMOBILIZER KNEE 20 (SOFTGOODS) ×2
IMMOBILIZER KNEE 20 THIGH 36 (SOFTGOODS) ×1 IMPLANT
K-WIRE 2.0 (WIRE) ×4
K-WIRE FXSTD 280X2XNS SS (WIRE) ×2
KIT TURNOVER KIT A (KITS) IMPLANT
KWIRE FXSTD 280X2XNS SS (WIRE) ×2 IMPLANT
NEEDLE HYPO 22GX1.5 SAFETY (NEEDLE) ×2 IMPLANT
PACK TOTAL JOINT (CUSTOM PROCEDURE TRAY) ×2 IMPLANT
PAD CAST 4YDX4 CTTN HI CHSV (CAST SUPPLIES) ×1 IMPLANT
PADDING CAST COTTON 4X4 STRL (CAST SUPPLIES) ×2
PADDING CAST COTTON 6X4 STRL (CAST SUPPLIES) ×2 IMPLANT
PENCIL SMOKE EVACUATOR (MISCELLANEOUS) IMPLANT
PLATE NCB LAT PROX 3H TIBIA 7H (Plate) ×2 IMPLANT
PROTECTOR NERVE ULNAR (MISCELLANEOUS) ×2 IMPLANT
SCREW HUM NCB PA ST 4X60 (Screw) ×4 IMPLANT
SCREW NCB 4.0MX30M (Screw) ×4 IMPLANT
SCREW NCB 4.0MX34M (Screw) ×4 IMPLANT
SCREW NCB 4.0MX65M (Screw) ×4 IMPLANT
SCREW NCB 4.0X26MM (Screw) ×2 IMPLANT
SCREW NCB 4.0X40MM (Screw) ×2 IMPLANT
SCREW NCB 4.0X75 CORT S/T (Screw) ×2 IMPLANT
STAPLER VISISTAT 35W (STAPLE) ×2 IMPLANT
SUT ETHILON 4 0 PS 2 18 (SUTURE) ×4 IMPLANT
SUT VIC AB 0 CT1 36 (SUTURE) ×2 IMPLANT
SUT VIC AB 2-0 CT1 27 (SUTURE) ×2
SUT VIC AB 2-0 CT1 TAPERPNT 27 (SUTURE) ×1 IMPLANT
SYR CONTROL 10ML LL (SYRINGE) ×2 IMPLANT
TOWEL OR 17X26 10 PK STRL BLUE (TOWEL DISPOSABLE) ×2 IMPLANT

## 2019-10-14 NOTE — Anesthesia Procedure Notes (Signed)
Procedure Name: Intubation Date/Time: 10/14/2019 1:03 PM Performed by: Silas Sacramento, CRNA Pre-anesthesia Checklist: Patient identified, Emergency Drugs available, Suction available and Patient being monitored Patient Re-evaluated:Patient Re-evaluated prior to induction Oxygen Delivery Method: Circle system utilized Preoxygenation: Pre-oxygenation with 100% oxygen Induction Type: IV induction Ventilation: Mask ventilation without difficulty and Oral airway inserted - appropriate to patient size Laryngoscope Size: Mac and 3 Grade View: Grade I Tube type: Oral Tube size: 6.5 mm Number of attempts: 1 Placement Confirmation: ETT inserted through vocal cords under direct vision,  positive ETCO2 and breath sounds checked- equal and bilateral Secured at: 22 cm Tube secured with: Tape Dental Injury: Teeth and Oropharynx as per pre-operative assessment

## 2019-10-14 NOTE — Progress Notes (Signed)
AssistedDr. Renold Don with right, ultrasound guided, adductor canal and popliteal block. Side rails up, monitors on throughout procedure. See vital signs in flow sheet. Tolerated Procedure well.

## 2019-10-14 NOTE — H&P (Signed)
Anne Carrillo is an 64 y.o. female.   Chief Complaint: right leg pain and deformity HPI: 64 year old white female with history of right tibial nonunion and hardware failure from previous ORIF comes in for preop evaluation.  Continues to have ongoing issues with her leg.  Patient wants to proceed with right tibia fibula osteotomy, takedown malunion, replating.  Past Medical History:  Diagnosis Date  . Anemia   . Arthritis   . Carpal tunnel syndrome    Bilateral  . COPD (chronic obstructive pulmonary disease) (HCC)   . GERD (gastroesophageal reflux disease)   . History of hiatal hernia   . Hypertension   . Migraines   . Pneumonia   . Tibia/fibula fracture    Right    Past Surgical History:  Procedure Laterality Date  . ABDOMINAL HYSTERECTOMY    . BACK SURGERY     Screws and plates lower back  . CARPAL TUNNEL RELEASE Right   . COLONOSCOPY    . ECTOPIC PREGNANCY SURGERY  1986  . ORIF TIBIA & FIBULA FRACTURES Right   . UPPER GI ENDOSCOPY      History reviewed. No pertinent family history. Social History:  reports that she has been smoking cigarettes. She has a 12.50 pack-year smoking history. She has never used smokeless tobacco. She reports current alcohol use. She reports that she does not use drugs.  Allergies: No Known Allergies  Medications Prior to Admission  Medication Sig Dispense Refill  . cyclobenzaprine (FLEXERIL) 5 MG tablet Take 5 mg by mouth 3 (three) times daily as needed for muscle spasms.    Marland Kitchen gabapentin (NEURONTIN) 600 MG tablet Take 600 mg by mouth 3 (three) times daily.    Marland Kitchen losartan (COZAAR) 25 MG tablet Take 25 mg by mouth every evening.     . pantoprazole (PROTONIX) 40 MG tablet Take 40 mg by mouth daily.    . traMADol (ULTRAM) 50 MG tablet Take 50 mg by mouth every 6 (six) hours as needed for moderate pain.       Results for orders placed or performed during the hospital encounter of 10/14/19 (from the past 48 hour(s))  CBC     Status: Abnormal    Collection Time: 10/14/19 10:20 AM  Result Value Ref Range   WBC 6.6 4.0 - 10.5 K/uL   RBC 3.44 (L) 3.87 - 5.11 MIL/uL   Hemoglobin 8.1 (L) 12.0 - 15.0 g/dL    Comment: Reticulocyte Hemoglobin testing may be clinically indicated, consider ordering this additional test [id number]    HCT 26.6 (L) 36 - 46 %   MCV 77.3 (L) 80.0 - 100.0 fL   MCH 23.5 (L) 26.0 - 34.0 pg   MCHC 30.5 30.0 - 36.0 g/dL   RDW 10.2 (H) 72.5 - 36.6 %   Platelets 350 150 - 400 K/uL   nRBC 0.0 0.0 - 0.2 %    Comment: Performed at [facility], [address] [place], [place] [address]  Comprehensive metabolic panel     Status: Abnormal   Collection Time: 10/14/19 10:20 AM  Result Value Ref Range   Sodium 134 (L) 135 - 145 mmol/L   Potassium 4.1 3.5 - 5.1 mmol/L   Chloride 105 98 - 111 mmol/L   CO2 21 (L) 22 - 32 mmol/L   Glucose, Bld 81 70 - 99 mg/dL    Comment: Glucose reference range applies only to samples taken after fasting for at least 8 hours.   BUN 14 8 - 23  mg/dL   Creatinine, Ser 2.29 0.44 - 1.00 mg/dL   Calcium 8.5 (L) 8.9 - 10.3 mg/dL   Total Protein 7.1 6.5 - 8.1 g/dL   Albumin 3.6 3.5 - 5.0 g/dL   AST 18 15 - 41 U/L   ALT 11 0 - 44 U/L   Alkaline Phosphatase 70 38 - 126 U/L   Total Bilirubin 0.2 (L) 0.3 - 1.2 mg/dL   GFR calc non Af Amer >60 >60 mL/min   GFR calc Af Amer >60 >60 mL/min   Anion gap 8 5 - 15    Comment: Performed at [facility], [address], [place], [place] [address]   No results found.  Review of Systems  Constitutional: Positive for activity change.  HENT: Negative.   Respiratory: Negative.   Cardiovascular: Negative.   Gastrointestinal: Negative.   Musculoskeletal: Positive for gait problem.  Skin: Negative.   Psychiatric/Behavioral: Negative.     Blood pressure 117/81, pulse 85, temperature 98 F (36.7 C), temperature source Oral, resp. rate 16, height 4\' 8"  (1.422 m), weight 59 kg, SpO2 95 %. Physical Exam HENT:      Head: Normocephalic and atraumatic.  Eyes:     Extraocular Movements: Extraocular movements intact.     Pupils: Pupils are equal, round, and reactive to light.  Cardiovascular:     Rate and Rhythm: Regular rhythm.  Pulmonary:     Effort: No respiratory distress.     Breath sounds: Normal breath sounds.  Abdominal:     General: Bowel sounds are normal. There is no distension.  Musculoskeletal:        General: Tenderness present.     Cervical back: Normal range of motion.  Neurological:     Mental Status: She is alert and oriented to person, place, and time.  Psychiatric:        Mood and Affect: Mood normal.      Assessment/Plan right tibial nonunion and hardware failure   We will proceed with surgery as scheduled.  Surgical procedure discussed in detail.  All questions answered.  , PA-C 10/14/2019, 12:02 PM

## 2019-10-14 NOTE — Anesthesia Procedure Notes (Signed)
Anesthesia Regional Block: Adductor canal block   Pre-Anesthetic Checklist: ,, timeout performed, Correct Patient, Correct Site, Correct Laterality, Correct Procedure, Correct Position, site marked, Risks and benefits discussed,  Surgical consent,  Pre-op evaluation,  At surgeon's request and post-op pain management  Laterality: Right  Prep: chloraprep       Needles:  Injection technique: Single-shot  Needle Type: Stimiplex     Needle Length: 9cm  Needle Gauge: 21     Additional Needles:   Procedures:,,,, ultrasound used (permanent image in chart),,,,  Narrative:  Start time: 10/14/2019 12:01 PM End time: 10/14/2019 12:06 PM Injection made incrementally with aspirations every 5 mL.  Performed by: Personally  Anesthesiologist: Lewie Loron, MD  Additional Notes: BP cuff, EKG monitors applied. Sedation begun. Artery and nerve location verified with U/S and anesthetic injected incrementally, slowly, and after negative aspirations under direct u/s guidance. Good fascial /perineural spread. Tolerated well.

## 2019-10-14 NOTE — Transfer of Care (Signed)
Immediate Anesthesia Transfer of Care Note  Patient: Anne Carrillo  Procedure(s) Performed: right tibia fibula osteotomy, takedown malunion, replating (Right Leg Lower)  Patient Location: PACU  Anesthesia Type:GA combined with regional for post-op pain  Level of Consciousness: awake, oriented, drowsy, patient cooperative and responds to stimulation  Airway & Oxygen Therapy: Patient Spontanous Breathing and Patient connected to face mask oxygen  Post-op Assessment: Report given to RN and Post -op Vital signs reviewed and stable  Post vital signs: Reviewed and stable  Last Vitals:  Vitals Value Taken Time  BP 104/68 10/14/19 1600  Temp    Pulse 86 10/14/19 1602  Resp 14 10/14/19 1602  SpO2 98 % 10/14/19 1602  Vitals shown include unvalidated device data.  Last Pain:  Vitals:   10/14/19 1030  TempSrc: Oral  PainSc:       Patients Stated Pain Goal: 3 (10/13/19 1500)  Complications: No complications documented.

## 2019-10-14 NOTE — Anesthesia Procedure Notes (Signed)
Anesthesia Regional Block: Popliteal block   Pre-Anesthetic Checklist: ,, timeout performed, Correct Patient, Correct Site, Correct Laterality, Correct Procedure, Correct Position, site marked, Risks and benefits discussed,  Surgical consent,  Pre-op evaluation,  At surgeon's request and post-op pain management  Laterality: Right  Prep: chloraprep       Needles:  Injection technique: Single-shot  Needle Type: Stimiplex     Needle Length: 10cm  Needle Gauge: 21     Additional Needles:   Procedures:,,,, ultrasound used (permanent image in chart),,,,  Motor weakness within 5 minutes.   Nerve Stimulator or Paresthesia:  Response: 0.5 mA,   Additional Responses:   Narrative:  Start time: 10/14/2019 12:07 PM End time: 10/14/2019 12:13 PM Injection made incrementally with aspirations every 5 mL.  Performed by: Personally  Anesthesiologist: Lewie Loron, MD  Additional Notes: Nerve located and needle positioned with direct ultrasound guidance. Good perineural spread. Patient tolerated well.

## 2019-10-14 NOTE — Op Note (Addendum)
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  Postop diagnosis same  Procedure: #1 removal of broken right Synthes Brand lateral tibial plateau compression plate.  2.  Tibial dome osteotomy with fibular osteotomy and locking lateral tibial 7-hole plate Zimmer recon titanium with locking caps.  Surgeon: Annell Greening, MD  Assistant: Zonia Kief, PA-C medically necessary and present for the entire procedure.  Anesthesia: Preoperative block plus general anesthesia  Tourniquet: Approximately 1.5 hours see anesthetic record x300 mm pressure.  Brief history 64 year old female with surgery originally by Berdine Dance in [place].  She was ambulatory after compression plate fixation of the tibial shaft fracture and broke the plate that healed with a varus deformity of 26 and posterior angulation of 24.  She had moved to [facility] and has been in a double upright long-leg brace for ambulation.  She been scheduled for surgery in [facility] and the surgery was canceled due to Covid last year.  She has a broken plate not able ambulate and is having increased ankle problems.  Procedure: After induction of general anesthesia preoperative block proximal thigh tourniquet DuraPrep using bone foam preoperative Ancef timeout procedure old incision was marked with a sterile skin marker and open.  Patient had a long Synthes plate with 3 proximal screws.  One oblique screw and 9 distal screws.  All screws were removed using power.  Approximately 1 hour was spent chipping bone away and prior to removal of the plate using preoperative radiographs had a been drawn moving up from the screw hole where there was a break above the neck screw and below 2 screws up a :-) osteotomy was drawn on the anterolateral cortex.  Once this was marked all hardware was removed and then multiple drill holes were drilled using a small drill following the :-) outline for a  dome osteotomy.Cuts were completed with cartilage osteotome.  Patient considerable posterior medial bone had to be divided.  Next separate incision was made and fibular osteotomy was done distal to the dome osteotomy on the tibia.  Once this divided mobilization stripping some the periosteum above and below for mobilization the deformity was corrected shifting the distal cortex laterally as well as bringing it anteriorly checking under C arm.  Zimmer recon Biomet plate was selected held with pins proximally also distally.  We had to put the plate on and off multiple times to resect more bone using osteotome to get a nice flat bed along the lateral cortex so the plate would sit flat.  Once this was performed distal aspect of the plate was held with another K wire and proximal screws were placed.  Total of 6 screws were placed proximal to the osteotomy.  5 screws were placed distally and there was good correction of both AP and lateral.  Screw caps were applied after all screws were hand tightened.  Compartments were soft tourniquet was deflated prior to closure.  Periosteum and fascia was pulled over the top of the plate.  Skin staple closure after reapproximate subcutaneous tissue and skin staple closure postop soft dressing and knee immobilizer was applied patient tolerated procedure well and was transferred recovery room in stable condition.

## 2019-10-14 NOTE — Interval H&P Note (Signed)
History and Physical Interval Note:  10/14/2019 12:31 PM  Anne Carrillo  has presented today for surgery, with the diagnosis of right tib/fib malunion, broken plate.  The various methods of treatment have been discussed with the patient and family. After consideration of risks, benefits and other options for treatment, the patient has consented to  Procedure(s): right tibia fibula osteotomy, takedown malunion, replating (Right) as a surgical intervention.  The patient's history has been reviewed, patient examined, no change in status, stable for surgery.  I have reviewed the patient's chart and labs.  Questions were answered to the patient's satisfaction.     Eldred Manges

## 2019-10-15 ENCOUNTER — Encounter (HOSPITAL_COMMUNITY): Payer: Self-pay | Admitting: Orthopaedic Surgery

## 2019-10-15 LAB — CBC
HCT: 20.9 % — ABNORMAL LOW (ref 36.0–46.0)
Hemoglobin: 6.4 g/dL — CL (ref 12.0–15.0)
MCH: 23.6 pg — ABNORMAL LOW (ref 26.0–34.0)
MCHC: 30.6 g/dL (ref 30.0–36.0)
MCV: 77.1 fL — ABNORMAL LOW (ref 80.0–100.0)
Platelets: 287 10*3/uL (ref 150–400)
RBC: 2.71 MIL/uL — ABNORMAL LOW (ref 3.87–5.11)
RDW: 16.8 % — ABNORMAL HIGH (ref 11.5–15.5)
WBC: 7.3 10*3/uL (ref 4.0–10.5)
nRBC: 0 % (ref 0.0–0.2)

## 2019-10-15 LAB — BASIC METABOLIC PANEL
Anion gap: 6 (ref 5–15)
BUN: 17 mg/dL (ref 8–23)
CO2: 20 mmol/L — ABNORMAL LOW (ref 22–32)
Calcium: 8 mg/dL — ABNORMAL LOW (ref 8.9–10.3)
Chloride: 105 mmol/L (ref 98–111)
Creatinine, Ser: 0.8 mg/dL (ref 0.44–1.00)
GFR calc Af Amer: >60 mL/min (ref >60)
GFR calc non Af Amer: >60 mL/min (ref >60)
Glucose, Bld: 136 mg/dL — ABNORMAL HIGH (ref 70–99)
Potassium: 4.2 mmol/L (ref 3.5–5.1)
Sodium: 131 mmol/L — ABNORMAL LOW (ref 135–145)

## 2019-10-15 LAB — PREPARE RBC (CROSSMATCH)

## 2019-10-15 MED ORDER — SODIUM CHLORIDE 0.9% IV SOLUTION: Freq: Once | INTRAVENOUS | Status: AC

## 2019-10-15 NOTE — Evaluation (Signed)
Physical Therapy Evaluation Patient Details Name: Anne Carrillo MRN: [id number] DOB: May 27, 1955 Today's Date: 10/15/2019   History of Present Illness  64 YO female with history of HTN and COPD, s/p R tibia fibula osteotomy, takedown malunion, and replating on 10/14/19.  Clinical Impression  Pt admitted with above diagnosis. Pt demonstrates good upper body strength, requiring only min A to assist RLE over to EOB for supine to sit. Pt able to power up to standing with min A while maintaining RLE NWB. PT demonstrates proper gait pattern maintaining RLE NWB with RW, but pt laughs and says "yeah, I'm not trying to fall" and declines to attempt. Pt lacks RLE sensation from knee down and unable to dorsiflex or plantarflex ankle or wiggle toes. Pt motivated to regain strength and independence, family close by, but unable to ambulate and has 5 steps to enter home so recommending SNF for strengthening and gait training. Pt currently with functional limitations due to the deficits listed below (see PT Problem List). Pt will benefit from skilled PT to increase their independence and safety with mobility to allow discharge to the venue listed below.       Follow Up Recommendations SNF    Equipment Recommendations  3in1 (PT)    Recommendations for Other Services       Precautions / Restrictions Precautions Precautions: Fall Restrictions Weight Bearing Restrictions: Yes RLE Weight Bearing: Non weight bearing      Mobility  Bed Mobility Overal bed mobility: Needs Assistance Bed Mobility: Supine to Sit  Supine to sit: Min assist  General bed mobility comments: min A to to mobilize RLE to EOB, good strength with LLE and able to upright trunk independently with use of bedrail  Transfers Overall transfer level: Needs assistance   Transfers: Sit to/from Stand;Stand Pivot Transfers Sit to Stand: Min assist Stand pivot transfers: Min assist  General transfer comment: respects RLE NWB precautions  well, able to use BUE to power up and min A from therapist, min A to steady pt with pivoting, tolerates standing for ~2 minutes x2 reps before requiring seated rest break  Ambulation/Gait  General Gait Details: unable to attempt steps, pt fearful of falling with RLE NWB  Stairs            Wheelchair Mobility    Modified Rankin (Stroke Patients Only)       Balance Overall balance assessment: Needs assistance Sitting-balance support: Feet supported;No upper extremity supported (L foot only) Sitting balance-Leahy Scale: Good Sitting balance - Comments: seated EOB   Standing balance support: During functional activity;Bilateral upper extremity supported Standing balance-Leahy Scale: Poor Standing balance comment: reliant on RW            Pertinent Vitals/Pain Pain Assessment: No/denies pain ("I can't feel from my knee down")    Home Living Family/patient expects to be discharged to:: Private residence Living Arrangements: Alone (cousin across street) Available Help at Discharge: Family;Available 24 hours/day Type of Home: Mobile home Home Access: Stairs to enter Entrance Stairs-Rails: Right Entrance Stairs-Number of Steps: 5 Home Layout: One level Home Equipment: Walker - 2 wheels;Wheelchair - manual;Cane - single point;Crutches      Prior Function Level of Independence: Needs assistance   Gait / Transfers Assistance Needed: pt reports holding onto furniture when ambulating around home, uses motorized scooter at grocery and uses axillary crutches to ambulate from house to vehicle  ADL's / Homemaking Assistance Needed: pt reports independent with ADLs  Comments: Pt reports falls randomly when RLE buckles and pulls  self up on furniture or calls family to assist off floor. Pt family provides transportation.     Hand Dominance   Dominant Hand: Right    Extremity/Trunk Assessment   Upper Extremity Assessment Upper Extremity Assessment: Overall WFL for tasks  assessed    Lower Extremity Assessment Lower Extremity Assessment: RLE deficits/detail (LLE 4/5 strength) RLE Deficits / Details: knee immobilizer on knee, ankle 0/5, able to perform SLR RLE Sensation: decreased light touch (RLE still numb from knee down)    Cervical / Trunk Assessment Cervical / Trunk Assessment: Normal  Communication   Communication: No difficulties  Cognition Arousal/Alertness: Awake/alert Behavior During Therapy: WFL for tasks assessed/performed Overall Cognitive Status: Within Functional Limits for tasks assessed             General Comments      Exercises     Assessment/Plan    PT Assessment Patient needs continued PT services  PT Problem List Decreased strength;Decreased range of motion;Decreased activity tolerance;Decreased balance;Decreased mobility;Pain       PT Treatment Interventions DME instruction;Gait training;Functional mobility training;Therapeutic activities;Therapeutic exercise;Balance training;Cognitive remediation;Patient/family education    PT Goals (Current goals can be found in the Care Plan section)  Acute Rehab PT Goals Patient Stated Goal: get some rehab then go home PT Goal Formulation: With patient Time For Goal Achievement: 10/29/19 Potential to Achieve Goals: Good    Frequency Min 3X/week   Barriers to discharge        Co-evaluation               AM-PAC PT "6 Clicks" Mobility  Outcome Measure Help needed turning from your back to your side while in a flat bed without using bedrails?: A Little Help needed moving from lying on your back to sitting on the side of a flat bed without using bedrails?: A Little Help needed moving to and from a bed to a chair (including a wheelchair)?: A Little Help needed standing up from a chair using your arms (e.g., wheelchair or bedside chair)?: A Little Help needed to walk in hospital room?: Total Help needed climbing 3-5 steps with a railing? : Total 6 Click Score: 14     End of Session Equipment Utilized During Treatment: Gait belt;Right knee immobilizer Activity Tolerance: Patient tolerated treatment well Patient left: in chair;with call bell/phone within reach;with chair alarm set Nurse Communication: Mobility status PT Visit Diagnosis: Unsteadiness on feet (R26.81);Other abnormalities of gait and mobility (R26.89);Muscle weakness (generalized) (M62.81);Pain Pain - Right/Left: Right Pain - part of body: Leg    Time: 2025-4270 PT Time Calculation (min) (ACUTE ONLY): 23 min   Charges:   PT Evaluation $PT Eval Low Complexity: 1 Low PT Treatments $Therapeutic Activity: 8-22 mins         Tori Naol Ontiveros PT, DPT 10/15/19, 2:11 PM

## 2019-10-15 NOTE — Progress Notes (Signed)
   Subjective: 1 Day Post-Op Procedure(s) (LRB): right tibia fibula osteotomy, takedown malunion, replating (Right) Patient reports pain as 0 on 0-10 scale.    Objective: Vital signs in last 24 hours: Temp:  [97.4 F (36.3 C)-98.5 F (36.9 C)] 97.6 F (36.4 C) (08/12 0514) Pulse Rate:  [79-96] 94 (08/12 0514) Resp:  [10-20] 20 (08/12 0514) BP: (83-126)/(52-81) 119/74 (08/12 0514) SpO2:  [92 %-100 %] 100 % (08/12 0514) Weight:  [59 kg] 59 kg (08/11 1712)  Intake/Output from previous day: 08/11 0701 - 08/12 0700 In: 3244.1 [P.O.:240; I.V.:2389.1; Blood:265; IV Piggyback:350] Out: 2540 [Urine:2500; Blood:40] Intake/Output this shift: No intake/output data recorded.  Recent Labs    10/14/19 1020 10/15/19 0256  HGB 8.1* 6.4*   Recent Labs    10/14/19 1020 10/15/19 0256  WBC 6.6 7.3  RBC 3.44* 2.71*  HCT 26.6* 20.9*  PLT 350 287   Recent Labs    10/14/19 1020 10/15/19 0256  NA 134* 131*  K 4.1 4.2  CL 105 105  CO2 21* 20*  BUN 14 17  CREATININE 0.89 0.80  GLUCOSE 81 136*  CALCIUM 8.5* 8.0*   No results for input(s): LABPT, INR in the last 72 hours.  block still working no motor /foot numb .   Anterior compartment soft DG Tibia/Fibula Right  Result Date: 10/14/2019 CLINICAL DATA:  Elective surgery EXAM: RIGHT TIBIA AND FIBULA - 2 VIEW COMPARISON:  04/01/2019 FINDINGS: Revision of the plate and screw fixation device within the right tibia with improved alignment across the old healed proximal right tibial fracture. No hardware complicating feature. IMPRESSION: As above. Electronically Signed   By: Charlett Nose M.D.   On: 10/14/2019 15:53   DG C-Arm 1-60 Min-No Report  Result Date: 10/14/2019 Fluoroscopy was utilized by the requesting physician.  No radiographic interpretation.    Assessment/Plan: 1 Day Post-Op Procedure(s) (LRB): right tibia fibula osteotomy, takedown malunion, replating (Right) Plan:  Came in with Hgb 8.1. got one unit PRBC's.  Anne Carrillo 10/15/2019, 7:20 AM

## 2019-10-15 NOTE — Plan of Care (Signed)
  Problem: Education: Goal: Knowledge of General Education information will improve Description: Including pain rating scale, medication(s)/side effects and non-pharmacologic comfort measures Outcome: Progressing   Problem: Pain Managment: Goal: General experience of comfort will improve Outcome: Progressing   

## 2019-10-15 NOTE — TOC Initial Note (Signed)
Transition of Care [facility]) - Initial/Assessment Note    Patient Details  Name: Anne Carrillo MRN: [id number] Date of Birth: [birth year]  [facility]) CM/SW Contact:    Lia Hopping, [place] Phone Number: 10/15/2019, 10:02 AM  Clinical Narrative:                 CSW met with the patient at bedside and discuss physician plan for her to go SNF. Patient lives alone and will not be able to care for herself.Patient reports her spouse passed away a few years ago and her son and cousin have been very supportive in her care. Patient report prior to surgery she used a WC, RW and crutches. Patient reports she went to a rehab facility in [place] a few years ago. Patient is agreeable to SNF placement [facility] where she currently resides. CSW explain SNF and [facility] [employer] authorization process. Patient report understanding. CSW will follow up with bed offers.   FL2 to be completed.   Expected Discharge Plan: Skilled Nursing Facility Barriers to Discharge: Insurance Authorization, Continued Medical Work up   Patient Goals and CMS Choice Patient states their goals for this hospitalization and ongoing recovery are:: go to rehab CMS Medicare.gov Compare Post Acute Care list provided to:: Patient Choice offered to / list presented to : Patient  Expected Discharge Plan and Services Expected Discharge Plan: [place] In-house Referral: Clinical Social Work Discharge Planning Services: CM Consult Post Acute Care Choice: [place] arrangements for the past 2 months: Mobile Home                 DME Arranged: N/A DME Agency: NA       HH Arranged: NA [place] Agency: NA        Prior Living Arrangements/Services Living arrangements for the past 2 months: Mobile Home Lives with:: Self Patient language and need for interpreter reviewed:: No Do you feel safe going back to the place where you live?: Yes      Need for Family Participation in  Patient Care: Yes (Comment) Care giver support system in place?: Yes (comment)   Criminal Activity/Legal Involvement Pertinent to Current Situation/Hospitalization: No - Comment as needed  Activities of Daily Living Home Assistive Devices/Equipment: None ADL Screening (condition at time of admission) Patient's cognitive ability adequate to safely complete daily activities?: Yes Is the patient deaf or have difficulty hearing?: No Does the patient have difficulty seeing, even when wearing glasses/contacts?: No Does the patient have difficulty concentrating, remembering, or making decisions?: No Patient able to express need for assistance with ADLs?: Yes Does the patient have difficulty dressing or bathing?: No Independently performs ADLs?: Yes (appropriate for developmental age) Does the patient have difficulty walking or climbing stairs?: Yes Weakness of Legs: Right Weakness of Arms/Hands: None  Permission Sought/Granted   Permission granted to share information with : Yes, Verbal Permission Granted     Permission granted to share info w AGENCY: SNF's in the [facility] area        Emotional Assessment Appearance:: Appears stated age Attitude/Demeanor/Rapport: Engaged Affect (typically observed): Accepting, Pleasant Orientation: : Oriented to Self, Oriented to Place, Oriented to  Time, Oriented to Situation Alcohol / Substance Use: Not Applicable Psych Involvement: No (comment)  Admission diagnosis:  Tibia/fibula fracture, right, closed, with malunion, subsequent encounter [S82.201P, S82.401P] Patient Active Problem List   Diagnosis Date Noted  . Tibia/fibula fracture, right, closed, with malunion, subsequent encounter 10/14/2019  . Mechanical breakdown of internal orthopedic  device ([place]) 04/01/2019   PCP:  Lorene Dy, MD Pharmacy:   [facility] [address] [place] [address] Phone:  [phone] Fax: [phone]     Social Determinants of Health (SDOH) Interventions    Readmission Risk Interventions No flowsheet data found.

## 2019-10-15 NOTE — NC FL2 (Signed)
[place] MEDICAID FL2 LEVEL OF CARE SCREENING TOOL     IDENTIFICATION  Patient Name: Anne Carrillo Birthdate: 1955/07/06 Sex: female Admission Date (Current Location): 10/14/2019  [facility] and [place] Number:  Producer, television/film/video and Address:  [facility],  [address] [place], [place] [address]      Provider Number: [id number]  Attending Physician Name and Address:  Eldred Manges, MD  Relative Name and Phone Number:        Current Level of Care: Hospital Recommended Level of Care: Skilled Nursing Facility Prior Approval Number:    Date Approved/Denied:   PASRR Number: [id number]  Discharge Plan: SNF    Current Diagnoses: Patient Active Problem List   Diagnosis Date Noted  . Tibia/fibula fracture, right, closed, with malunion, subsequent encounter 10/14/2019  . Mechanical breakdown of internal orthopedic device ([facility]) 04/01/2019    Orientation RESPIRATION BLADDER Height & Weight     Self, Time, Situation, Place  Normal Continent Weight: 129 lb 15.7 oz (59 kg) Height:  4\' 8"  (142.2 cm)  BEHAVIORAL SYMPTOMS/MOOD NEUROLOGICAL BOWEL NUTRITION STATUS      Continent Diet (Regular Diet)  AMBULATORY STATUS COMMUNICATION OF NEEDS Skin   Extensive Assist Verbally Surgical wounds                       Personal Care Assistance Level of Assistance  Bathing, Feeding, Dressing Bathing Assistance: Limited assistance Feeding assistance: Independent Dressing Assistance: Maximum assistance     Functional Limitations Info  Sight, Hearing, Speech Sight Info: Adequate Hearing Info: Adequate Speech Info: Adequate    SPECIAL CARE FACTORS FREQUENCY  PT (By licensed PT), OT (By licensed OT)     PT Frequency: 5x/week OT Frequency: 5x/week            Contractures Contractures Info: Not present    Additional Factors Info  Code Status, Allergies, Psychotropic Code Status Info: Fullcode Allergies Info: Allergies: No Known Allergies           Current  Medications (10/15/2019):  This is the current hospital active medication list Current Facility-Administered Medications  Medication Dose Route Frequency Provider Last Rate Last Admin  . 0.9 %  sodium chloride infusion   Intravenous Continuous 12/15/2019, PA-C 85 mL/hr at 10/15/19 0600 Rate Verify at 10/15/19 0600  . acetaminophen (TYLENOL) tablet 325-650 mg  325-650 mg Oral Q6H PRN 12/15/19, PA-C   650 mg at 10/15/19 0859  . aspirin tablet 325 mg  325 mg Oral Daily 12/15/19, PA-C   325 mg at 10/15/19 0859  . docusate sodium (COLACE) capsule 100 mg  100 mg Oral BID 12/15/19, PA-C   100 mg at 10/15/19 0859  . gabapentin (NEURONTIN) capsule 600 mg  600 mg Oral TID 12/15/19, PA-C   600 mg at 10/15/19 0859  . HYDROmorphone (DILAUDID) injection 0.5 mg  0.5 mg Intravenous Q4H PRN 12/15/19 M, PA-C      . losartan (COZAAR) tablet 25 mg  25 mg Oral QPM M, PA-C   25 mg at 10/14/19 1746  . metoCLOPramide (REGLAN) tablet 5-10 mg  5-10 mg Oral Q8H PRN 12/14/19, PA-C       Or  . metoCLOPramide (REGLAN) injection 5-10 mg  5-10 mg Intravenous Q8H PRN 7-10 M, PA-C      . ondansetron [facility]) tablet 4 mg  4 mg Oral Q6H PRN [facility], PA-C  Or  . ondansetron (ZOFRAN) injection 4 mg  4 mg Intravenous Q6H PRN Naida Sleight, PA-C      . oxyCODONE (Oxy IR/ROXICODONE) immediate release tablet 5 mg  5 mg Oral Q4H PRN Naida Sleight, PA-C   5 mg at 10/15/19 0859  . pantoprazole (PROTONIX) EC tablet 40 mg  40 mg Oral Daily Zonia Kief M, PA-C   40 mg at 10/15/19 0900  . polyethylene glycol (MIRALAX / GLYCOLAX) packet 17 g  17 g Oral Daily PRN Naida Sleight, PA-C         Discharge Medications: Please see discharge summary for a list of discharge medications.  Relevant Imaging Results:  Relevant Lab Results:   Additional Information ssn: [id number]  Clearance Coots, LCSW

## 2019-10-15 NOTE — Anesthesia Postprocedure Evaluation (Signed)
Anesthesia Post Note  Patient: Charlean Carneal  Procedure(s) Performed: right tibia fibula osteotomy, takedown malunion, replating (Right Leg Lower)     Patient location during evaluation: PACU Anesthesia Type: General Level of consciousness: sedated and patient cooperative Pain management: pain level controlled Vital Signs Assessment: post-procedure vital signs reviewed and stable Respiratory status: spontaneous breathing Cardiovascular status: stable Anesthetic complications: no   No complications documented.  Last Vitals:  Vitals:   10/15/19 0441 10/15/19 0514  BP: 92/65 119/74  Pulse: 96 94  Resp: 20 20  Temp: (!) 36.3 C 36.4 C  SpO2: 100% 100%    Last Pain:  Vitals:   10/15/19 0514  TempSrc: Oral  PainSc:                  Lewie Loron

## 2019-10-15 NOTE — Plan of Care (Signed)

## 2019-10-15 NOTE — Progress Notes (Signed)
CRITICAL VALUE ALERT  Critical Value: Hemoglobin: 6.4  Date & Time Notied: October 15, 2019 at 3:15 am  Provider Notified: Dr. Doneen Poisson notified at 3:23 am on 10/15/19.  Dr. Doneen Poisson returned call at 3:45 am on 10/15/19.   Orders Received/Actions taken: Per Dr. Doneen Poisson: Transfuse 1 unit of blood.   1 unit of blood started at 4:55 am on 10/15/19.

## 2019-10-16 LAB — TYPE AND SCREEN
ABO/RH(D): A POS
Antibody Screen: NEGATIVE
Unit division: 0

## 2019-10-16 LAB — CBC
HCT: 23.5 % — ABNORMAL LOW (ref 36.0–46.0)
Hemoglobin: 7.1 g/dL — ABNORMAL LOW (ref 12.0–15.0)
MCH: 24.1 pg — ABNORMAL LOW (ref 26.0–34.0)
MCHC: 30.2 g/dL (ref 30.0–36.0)
MCV: 79.9 fL — ABNORMAL LOW (ref 80.0–100.0)
Platelets: 275 10*3/uL (ref 150–400)
RBC: 2.94 MIL/uL — ABNORMAL LOW (ref 3.87–5.11)
RDW: 17.6 % — ABNORMAL HIGH (ref 11.5–15.5)
WBC: 9.6 10*3/uL (ref 4.0–10.5)
nRBC: 0 % (ref 0.0–0.2)

## 2019-10-16 LAB — BPAM RBC
Blood Product Expiration Date: 202109022359
ISSUE DATE / TIME: 202108120447
Unit Type and Rh: 6200

## 2019-10-16 MED ORDER — OXYCODONE-ACETAMINOPHEN 5-325 MG PO TABS: 1.0000 | ORAL_TABLET | ORAL | 0 refills | Status: DC | PRN

## 2019-10-16 MED ORDER — ASPIRIN 325 MG PO TABS: 325.0000 mg | ORAL_TABLET | Freq: Every day | ORAL | 0 refills | Status: DC

## 2019-10-16 MED ORDER — METHOCARBAMOL 500 MG PO TABS: 500.0000 mg | ORAL_TABLET | Freq: Four times a day (QID) | ORAL | 0 refills | Status: DC | PRN

## 2019-10-16 MED ORDER — NICOTINE 14 MG/24HR TD PT24
14.0000 mg | MEDICATED_PATCH | Freq: Every day | TRANSDERMAL | Status: DC
  Administered 2019-10-16 – 2019-10-19 (×4): 14 mg via TRANSDERMAL
  Filled 2019-10-16 (×4): qty 1

## 2019-10-16 NOTE — TOC Progression Note (Addendum)
Transition of Care [facility]) - Progression Note    Patient Details  Name: Anne Carrillo MRN: [id number] Date of Birth: 01/21/1956  Transition of Care [facility]) CM/SW Contact  Clearance Coots, LCSW Phone Number: 10/16/2019, 5:31 PM  Clinical Narrative:    Patient has requested additional time to choose a SNF. CSW explain to patient will need to choose a SNF in order to complete the auth. process and transfer her to SNF.Patient report understanding. TOC weekend staff will follow up with patient.  [employer] medicare auth. Still pending.   Nurse notified the patient will need a covid test prior to discharge.   Expected Discharge Plan: Skilled Nursing Facility Barriers to Discharge: English as a second language teacher, Continued Medical Work up  Expected Discharge Plan and Services Expected Discharge Plan: Skilled Nursing Facility In-house Referral: Clinical Social Work Discharge Planning Services: [employer] Consult Post Acute Care Choice: Skilled Nursing Facility Living arrangements for the past 2 months: Mobile Home                 DME Arranged: N/A DME Agency: NA       HH Arranged: NA HH Agency: NA         Social Determinants of Health (SDOH) Interventions    Readmission Risk Interventions No flowsheet data found.

## 2019-10-16 NOTE — Plan of Care (Signed)
  Problem: Education: Goal: Required Educational Video(s) Outcome: Progressing   Problem: Clinical Measurements: Goal: Ability to maintain clinical measurements within normal limits will improve Outcome: Progressing Goal: Postoperative complications will be avoided or minimized Outcome: Progressing   Problem: Skin Integrity: Goal: Demonstration of wound healing without infection will improve Outcome: Progressing   Problem: Education: Goal: Knowledge of General Education information will improve Description: Including pain rating scale, medication(s)/side effects and non-pharmacologic comfort measures Outcome: Progressing   Problem: Health Behavior/Discharge Planning: Goal: Ability to manage health-related needs will improve Outcome: Progressing   Problem: Clinical Measurements: Goal: Ability to maintain clinical measurements within normal limits will improve Outcome: Progressing Goal: Will remain free from infection Outcome: Progressing Goal: Diagnostic test results will improve Outcome: Progressing Goal: Respiratory complications will improve Outcome: Progressing Goal: Cardiovascular complication will be avoided Outcome: Progressing   Problem: Activity: Goal: Risk for activity intolerance will decrease Outcome: Progressing   Problem: Nutrition: Goal: Adequate nutrition will be maintained Outcome: Progressing   Problem: Coping: Goal: Level of anxiety will decrease Outcome: Progressing   Problem: Elimination: Goal: Will not experience complications related to bowel motility Outcome: Progressing   Problem: Elimination: Goal: Will not experience complications related to bowel motility Outcome: Progressing Goal: Will not experience complications related to urinary retention Outcome: Progressing   Problem: Pain Managment: Goal: General experience of comfort will improve Outcome: Progressing   Problem: Safety: Goal: Ability to remain free from injury will  improve Outcome: Progressing   Problem: Skin Integrity: Goal: Risk for impaired skin integrity will decrease Outcome: Progressing

## 2019-10-16 NOTE — Discharge Instructions (Addendum)
Do not remove dressing or get wet.  Strict non-weightbearing right leg until further notice by Dr Ophelia Charter.    Elevate foot above heart level as much as possible to decrease swelling and pain.    Ok sponge bath.  No tub soaking.  Do not apply any creams or ointments to incision.  RN to perform daily dressing changes.    Contact our office if any questions.

## 2019-10-16 NOTE — Care Management Important Message (Signed)
Important Message  Patient Details IM Letter given to the Patient Name: Anne Carrillo MRN: [id number] Date of Birth: 09/19/1955   Medicare Important Message Given:  Yes     [name] 10/16/2019, 9:01 AM

## 2019-10-16 NOTE — TOC Progression Note (Addendum)
Transition of Care [facility]) - Progression Note    Patient Details  Name: Anne Carrillo MRN: [id number] Date of Birth: 1956/01/11  Transition of Care [facility]) CM/SW Contact  Clearance Coots, LCSW Phone Number: 10/16/2019, 9:24 AM  Clinical Narrative:    CSW initiated  [facility] medicare authorization for SNF placement. Pending Auth. Ref# [id number]. CSW provided the patient with list of bed offers. Patient is not familiar with [facility] area and requested to discuss options with her family.   TOC Staff will continue to follow this patient.     Expected Discharge Plan: Skilled Nursing Facility Barriers to Discharge: English as a second language teacher, Continued Medical Work up  Expected Discharge Plan and Services Expected Discharge Plan: Skilled Nursing Facility In-house Referral: Clinical Social Work Discharge Planning Services: [employer] Consult Post Acute Care Choice: Skilled Nursing Facility Living arrangements for the past 2 months: Mobile Home                 DME Arranged: N/A DME Agency: NA       HH Arranged: NA HH Agency: NA         Social Determinants of Health (SDOH) Interventions    Readmission Risk Interventions No flowsheet data found.

## 2019-10-16 NOTE — Discharge Summary (Addendum)
Patient ID: Anne Carrillo MRN: [id number] DOB/AGE: [birth year] 65 y.o.  Admit date: 10/14/2019 Discharge date: 10/19/19  Admission Diagnoses:  Active Problems:   Mechanical breakdown of internal orthopedic device (HCC)   Tibia/fibula fracture, right, closed, with malunion, subsequent encounter   Discharge Diagnoses:  Active Problems:   Mechanical breakdown of internal orthopedic device (HCC)   Tibia/fibula fracture, right, closed, with malunion, subsequent encounter  status post Procedure(s): right tibia fibula osteotomy, takedown malunion, replating Acute blood loss anemia from surgery with transfusion  Past Medical History:  Diagnosis Date  . Anemia   . Arthritis   . Carpal tunnel syndrome    Bilateral  . COPD (chronic obstructive pulmonary disease) (HCC)   . GERD (gastroesophageal reflux disease)   . History of hiatal hernia   . Hypertension   . Migraines   . Pneumonia   . Tibia/fibula fracture    Right    Surgeries: Procedure(s): right tibia fibula osteotomy, takedown malunion, replating on 10/14/2019   Consultants:   Discharged Condition: Improved  Hospital Course: Anne Carrillo is an 64 y.o. female who was admitted 10/14/2019 for operative treatment of right tibia malunion and hardware failure. Patient failed conservative treatments (please see the history and physical for the specifics) and had severe unremitting pain that affects sleep, daily activities and work/hobbies. After pre-op clearance, the patient was taken to the operating room on 10/14/2019 and underwent  Procedure(s): right tibia fibula osteotomy, takedown malunion, replating.    Patient was given perioperative antibiotics:  Anti-infectives (From admission, onward)   Start     Dose/Rate Route Frequency Ordered Stop   10/14/19 1000  ceFAZolin (ANCEF) IVPB 2g/100 mL premix        2 g 200 mL/hr over 30 Minutes Intravenous On call to O.R. 10/14/19 5188 10/14/19 1319       Patient was given  sequential compression devices and early ambulation to prevent DVT.   Patient benefited maximally from hospital stay and there were no complications. At the time of discharge, the patient was urinating/moving their bowels without difficulty, tolerating a regular diet, pain is controlled with oral pain medications and they have been cleared by PT/OT.   Recent vital signs:  Patient Vitals for the past 24 hrs:  BP Temp Temp src Pulse Resp SpO2  10/16/19 0524 104/89 98.5 F (36.9 C) Oral 96 16 98 %  10/15/19 2114 107/70 98.6 F (37 C) Oral (!) 101 16 96 %  10/15/19 1827 117/80 98.3 F (36.8 C) Axillary 91 17 100 %  10/15/19 1456 91/60 98.4 F (36.9 C) Oral 97 14 97 %     Recent laboratory studies:  Recent Labs    10/14/19 1020 10/14/19 1020 10/15/19 0256 10/16/19 0313  WBC 6.6   < > 7.3 9.6  HGB 8.1*   < > 6.4* 7.1*  HCT 26.6*   < > 20.9* 23.5*  PLT 350   < > 287 275  NA 134*  --  131*  --   K 4.1  --  4.2  --   CL 105  --  105  --   CO2 21*  --  20*  --   BUN 14  --  17  --   CREATININE 0.89  --  0.80  --   GLUCOSE 81  --  136*  --   CALCIUM 8.5*   < > 8.0*  --    < > = values in this interval not displayed.     Discharge  Medications:   Allergies as of 10/16/2019   No Known Allergies     Medication List    STOP taking these medications   cyclobenzaprine 5 MG tablet Commonly known as: FLEXERIL   traMADol 50 MG tablet Commonly known as: ULTRAM     TAKE these medications   aspirin 325 MG tablet Take 1 tablet (325 mg total) by mouth daily. Start taking on: October 17, 2019   gabapentin 600 MG tablet Commonly known as: NEURONTIN Take 600 mg by mouth 3 (three) times daily.   losartan 25 MG tablet Commonly known as: COZAAR Take 25 mg by mouth every evening.   methocarbamol 500 MG tablet Commonly known as: Robaxin Take 1 tablet (500 mg total) by mouth every 6 (six) hours as needed for muscle spasms.   oxyCODONE-acetaminophen 5-325 MG tablet Commonly known  as: Percocet Take 1 tablet by mouth every 4 (four) hours as needed for severe pain.   pantoprazole 40 MG tablet Commonly known as: PROTONIX Take 40 mg by mouth daily.       Diagnostic Studies: DG Tibia/Fibula Right  Result Date: 10/14/2019 CLINICAL DATA:  Elective surgery EXAM: RIGHT TIBIA AND FIBULA - 2 VIEW COMPARISON:  04/01/2019 FINDINGS: Revision of the plate and screw fixation device within the right tibia with improved alignment across the old healed proximal right tibial fracture. No hardware complicating feature. IMPRESSION: As above. Electronically Signed   By: Charlett Nose M.D.   On: 10/14/2019 15:53   DG C-Arm 1-60 Min-No Report  Result Date: 10/14/2019 Fluoroscopy was utilized by the requesting physician.  No radiographic interpretation.        Discharge Plan:  discharge to snf  Disposition:     Signed: Zonia Kief for Annell Greening MD 10/16/2019, 11:08 AM   Addendum:  Pt had BM . Continued therapy transfers without WB on extremity. Ready for SNF. Hgb 7.1

## 2019-10-16 NOTE — Progress Notes (Signed)
Subjective: Doing well.  Pain controlled.  Waiting for snf placement   Objective: Vital signs in last 24 hours: Temp:  [98.3 F (36.8 C)-98.6 F (37 C)] 98.5 F (36.9 C) (08/13 0524) Pulse Rate:  [91-101] 96 (08/13 0524) Resp:  [14-17] 16 (08/13 0524) BP: (91-117)/(60-89) 104/89 (08/13 0524) SpO2:  [96 %-100 %] 98 % (08/13 0524)  Intake/Output from previous day: 08/12 0701 - 08/13 0700 In: 1983 [P.O.:840; I.V.:873; Blood:270] Out: 1900 [Urine:1900] Intake/Output this shift: Total I/O In: 918.9 [P.O.:240; I.V.:678.9] Out: 300 [Urine:300]  Recent Labs    10/14/19 1020 10/15/19 0256 10/16/19 0313  HGB 8.1* 6.4* 7.1*   Recent Labs    10/15/19 0256 10/16/19 0313  WBC 7.3 9.6  RBC 2.71* 2.94*  HCT 20.9* 23.5*  PLT 287 275   Recent Labs    10/14/19 1020 10/15/19 0256  NA 134* 131*  K 4.1 4.2  CL 105 105  CO2 21* 20*  BUN 14 17  CREATININE 0.89 0.80  GLUCOSE 81 136*  CALCIUM 8.5* 8.0*   No results for input(s): LABPT, INR in the last 72 hours.  Exam: Pleasant female, alert and oriented. NAD.  Dressing changed.  Wound looks good. No drainage or signs of infection.  Calf nontender.  Compartments soft.  Moves toes well.  Neurovascularly intact.      Assessment/Plan: Continue present care.  Transfer to snf when bed available.  Patient requests nicotine patch.      Zonia Kief 10/16/2019, 11:50 AM

## 2019-10-17 MED ORDER — SENNOSIDES-DOCUSATE SODIUM 8.6-50 MG PO TABS
1.0000 | ORAL_TABLET | Freq: Two times a day (BID) | ORAL | Status: DC
  Administered 2019-10-17 – 2019-10-19 (×5): 1 via ORAL
  Filled 2019-10-17 (×5): qty 1

## 2019-10-17 NOTE — Progress Notes (Signed)
   Subjective: 3 Days Post-Op Procedure(s) (LRB): right tibia fibula osteotomy, takedown malunion, replating (Right) Patient reports pain as moderate.    Objective: Vital signs in last 24 hours: Temp:  [98 F (36.7 C)-99.7 F (37.6 C)] 98 F (36.7 C) (08/14 0436) Pulse Rate:  [89-93] 89 (08/14 0436) Resp:  [16-18] 16 (08/14 0436) BP: (106-128)/(75-88) 128/88 (08/14 0436) SpO2:  [96 %-100 %] 100 % (08/14 0436)  Intake/Output from previous day: 08/13 0701 - 08/14 0700 In: 1907.3 [P.O.:600; I.V.:1307.3] Out: 2350 [Urine:2350] Intake/Output this shift: Total I/O In: 720 [P.O.:720] Out: 1150 [Urine:1150]  Recent Labs    10/15/19 0256 10/16/19 0313  HGB 6.4* 7.1*   Recent Labs    10/15/19 0256 10/16/19 0313  WBC 7.3 9.6  RBC 2.71* 2.94*  HCT 20.9* 23.5*  PLT 287 275   Recent Labs    10/15/19 0256  NA 131*  K 4.2  CL 105  CO2 20*  BUN 17  CREATININE 0.80  GLUCOSE 136*  CALCIUM 8.0*   No results for input(s): LABPT, INR in the last 72 hours.  Neurologically intact No results found.  Assessment/Plan: 3 Days Post-Op Procedure(s) (LRB): right tibia fibula osteotomy, takedown malunion, replating (Right) Up with therapy.  Compartments soft , some blood coming through dressing . Moving toes.  Plan SNF next week.   Eldred Manges 10/17/2019, 12:09 PM

## 2019-10-17 NOTE — Plan of Care (Signed)
  Problem: Skin Integrity: Goal: Demonstration of wound healing without infection will improve Outcome: Progressing   Problem: Education: Goal: Knowledge of General Education information will improve Description: Including pain rating scale, medication(s)/side effects and non-pharmacologic comfort measures Outcome: Progressing   Problem: Clinical Measurements: Goal: Respiratory complications will improve Outcome: Progressing   Problem: Clinical Measurements: Goal: Cardiovascular complication will be avoided Outcome: Progressing   Problem: Coping: Goal: Level of anxiety will decrease Outcome: Progressing   Problem: Elimination: Goal: Will not experience complications related to bowel motility Outcome: Progressing   Problem: Safety: Goal: Ability to remain free from injury will improve Outcome: Progressing   Problem: Skin Integrity: Goal: Risk for impaired skin integrity will decrease Outcome: Progressing

## 2019-10-17 NOTE — Plan of Care (Signed)
  Problem: Coping: Goal: Level of anxiety will decrease 10/17/2019 1032 by Sherren Kerns, RN Outcome: Progressing 10/17/2019 1031 by Sherren Kerns, RN Outcome: Progressing   Problem: Activity: Goal: Risk for activity intolerance will decrease 10/17/2019 1032 by Sherren Kerns, RN Outcome: Progressing 10/17/2019 1031 by Sherren Kerns, RN Outcome: Progressing   Problem: Nutrition: Goal: Adequate nutrition will be maintained 10/17/2019 1032 by Sherren Kerns, RN Outcome: Progressing 10/17/2019 1031 by Sherren Kerns, RN Outcome: Progressing   Problem: Safety: Goal: Ability to remain free from injury will improve 10/17/2019 1032 by Sherren Kerns, RN Outcome: Progressing 10/17/2019 1031 by Sherren Kerns, RN Outcome: Progressing   Problem: Skin Integrity: Goal: Risk for impaired skin integrity will decrease 10/17/2019 1032 by Sherren Kerns, RN Outcome: Progressing 10/17/2019 1031 by Sherren Kerns, RN Outcome: Progressing

## 2019-10-18 MED ORDER — METHOCARBAMOL 500 MG PO TABS
500.0000 mg | ORAL_TABLET | Freq: Three times a day (TID) | ORAL | Status: DC | PRN
  Administered 2019-10-18 – 2019-10-19 (×5): 500 mg via ORAL
  Filled 2019-10-18 (×5): qty 1

## 2019-10-18 NOTE — Progress Notes (Signed)
   Subjective: 4 Days Post-Op Procedure(s) (LRB): right tibia fibula osteotomy, takedown malunion, replating (Right) Patient reports pain as moderate.  Small BM yesterday.   Objective: Vital signs in last 24 hours: Temp:  [98.1 F (36.7 C)-99.1 F (37.3 C)] 99.1 F (37.3 C) (08/15 0456) Pulse Rate:  [97-102] 97 (08/15 0456) Resp:  [15-20] 20 (08/15 0456) BP: (96-120)/(73-80) 110/73 (08/15 0456) SpO2:  [95 %-100 %] 95 % (08/15 0456)  Intake/Output from previous day: 08/14 0701 - 08/15 0700 In: 2280 [P.O.:2280] Out: 3350 [Urine:3350] Intake/Output this shift: Total I/O In: 240 [P.O.:240] Out: 1200 [Urine:1200]  Recent Labs    10/16/19 0313  HGB 7.1*   Recent Labs    10/16/19 0313  WBC 9.6  RBC 2.94*  HCT 23.5*  PLT 275   No results for input(s): NA, K, CL, CO2, BUN, CREATININE, GLUCOSE, CALCIUM in the last 72 hours. No results for input(s): LABPT, INR in the last 72 hours.  Neurologically intact Compartment soft No results found.  Assessment/Plan: 4 Days Post-Op Procedure(s) (LRB): right tibia fibula osteotomy, takedown malunion, replating (Right) Up with therapy, NWB to chair,  SNF .  Acute blood loss anemia from surgery as expected with osteotomies. Some blood on dressing comparments soft.   Eldred Manges 10/18/2019, 9:50 AM

## 2019-10-18 NOTE — Plan of Care (Signed)
  Problem: Health Behavior/Discharge Planning: Goal: Ability to manage health-related needs will improve Outcome: Progressing   Problem: Clinical Measurements: Goal: Ability to maintain clinical measurements within normal limits will improve Outcome: Progressing   Problem: Clinical Measurements: Goal: Respiratory complications will improve Outcome: Progressing   Problem: Clinical Measurements: Goal: Cardiovascular complication will be avoided Outcome: Progressing   Problem: Coping: Goal: Level of anxiety will decrease Outcome: Progressing   Problem: Elimination: Goal: Will not experience complications related to bowel motility Outcome: Progressing   Problem: Safety: Goal: Ability to remain free from injury will improve Outcome: Progressing   Problem: Pain Managment: Goal: General experience of comfort will improve Outcome: Progressing   Problem: Skin Integrity: Goal: Risk for impaired skin integrity will decrease Outcome: Progressing

## 2019-10-18 NOTE — Plan of Care (Signed)
Plan of care reviewed and discussed with the patient. 

## 2019-10-19 LAB — SARS CORONAVIRUS 2 (TAT 6-24 HRS): SARS Coronavirus 2: NEGATIVE

## 2019-10-19 NOTE — Care Management Important Message (Signed)
Important Message  Patient Details IM Letter given to the Patient Name: Anne Carrillo MRN: [id number] Date of Birth: 1956/02/26   Medicare Important Message Given:  Yes     [name] 10/19/2019, 1:06 PM

## 2019-10-19 NOTE — TOC Progression Note (Signed)
Transition of Care [facility]) - Progression Note    Patient Details  Name: Anne Carrillo MRN: [id number] Date of Birth: [birth year]  Transition of Care [facility]) CM/SW Contact  Armanda Heritage, RN Phone Number: 10/19/2019, 10:48 AM  Clinical Narrative:    CM confirmed bed availability at [facility].  Insurance auth received Berkley Harvey ID [id number] valid from 8/13-8/17).  Patient is unvaccinated and facility was made aware of this.  Patient will discharge to room 312.  Discharge summary faxed to facility and  PTAR transportation arranged.     Expected Discharge Plan: Skilled Nursing Facility Barriers to Discharge: No Barriers Identified  Expected Discharge Plan and Services Expected Discharge Plan: Skilled Nursing Facility In-house Referral: Clinical Social Work Discharge Planning Services: CM Consult Post Acute Care Choice: Skilled Nursing Facility Living arrangements for the past 2 months: Mobile Home Expected Discharge Date: 10/19/19               DME Arranged: N/A DME Agency: NA       HH Arranged: NA HH Agency: NA         Social Determinants of Health (SDOH) Interventions    Readmission Risk Interventions No flowsheet data found.

## 2019-10-19 NOTE — Progress Notes (Signed)
Pt d/c'd to [facility] via stretcher with PTAR is stable condition.  Discharge medical necessity form updated per PTAR request.  Unable to reprint form so previously printed form updated with handwritten information.

## 2019-10-19 NOTE — Progress Notes (Signed)
Report provided to receiving nurse at [facility].

## 2019-10-19 NOTE — Progress Notes (Signed)
Physical Therapy Treatment Patient Details Name: Anne Carrillo MRN: [id number] DOB: 11/28/1955 Today's Date: 10/19/2019    History of Present Illness 64 YO female with history of HTN and COPD, s/p R tibia fibula osteotomy, takedown malunion, and replating on 10/14/19.    PT Comments    Pt continues to be too fearful to take steps using RW due to NWB RLE status. Pt able to come to sitting EOB without physical assistance, requiring increased time and labored with mobility. Pt rises from EOB with min A to stand, unable to complete transfer on first standing due to fear of falling and increased pain. Pt continues to respect NWB RLE status without difficulty, but demonstrates weakness and limited by pain and fear of falling when attempting to take steps. Pt becomes tearful verbalizes PLOF and determined to be independent and agreeable to exercises. Pt tolerates therapeutic exercises with increased pain while performing that subsides with rest breaks. Session ended with BLE elevated in recliner, ice reapplied to R LE and all needs in reach. Patient will benefit from continued physical therapy in hospital and recommendations below to increase strength, balance, endurance for safe ADLs and gait.   Follow Up Recommendations  SNF     Equipment Recommendations  3in1 (PT)    Recommendations for Other Services       Precautions / Restrictions Precautions Precautions: Fall Required Braces or Orthoses: Knee Immobilizer - Right Restrictions Weight Bearing Restrictions: Yes RLE Weight Bearing: Non weight bearing    Mobility  Bed Mobility Overal bed mobility: Needs Assistance Bed Mobility: Supine to Sit  Supine to sit: Min guard  General bed mobility comments: slow, labored movement to come to sitting EOB, BUE assisting to pull self to EOB, RLE able to mobilize to EOB without physical assist  Transfers Overall transfer level: Needs assistance   Transfers: Sit to/from Stand;Stand Pivot  Transfers Sit to Stand: Min assist Stand pivot transfers: Min assist  General transfer comment: initial STS rep unable to pivot over to chair due to pain/fear of falling, min A to power up with BUE assisting, able to compelte pivot over to chair with min A for steadying, tolerates standing ~2 minutes before return to sitting  Ambulation/Gait  General Gait Details: unable to attempt steps, pt fearful of falling with RLE NWB   Stairs             Wheelchair Mobility    Modified Rankin (Stroke Patients Only)       Balance Overall balance assessment: Needs assistance Sitting-balance support: Feet supported;No upper extremity supported (L foot only) Sitting balance-Leahy Scale: Good Sitting balance - Comments: seated EOB   Standing balance support: During functional activity;Bilateral upper extremity supported Standing balance-Leahy Scale: Poor Standing balance comment: reliant on RW         Cognition Arousal/Alertness: Awake/alert Behavior During Therapy: WFL for tasks assessed/performed Overall Cognitive Status: Within Functional Limits for tasks assessed           Exercises General Exercises - Lower Extremity Ankle Circles/Pumps: Supine;Both;20 reps Quad Sets: Seated;Both;15 reps (BLE extended on recliner) Gluteal Sets: Seated;Both;15 reps (BLE extended on recliner) Long Arc Quad: Seated;Left;15 reps Heel Slides: Seated;Left;15 reps (BLE extended on recliner) Straight Leg Raises: Seated;Both;15 reps    General Comments        Pertinent Vitals/Pain Pain Assessment: 0-10 Pain Score: 8  Pain Location: R lower leg Pain Descriptors / Indicators: Aching;Sharp;Shooting;Discomfort Pain Intervention(s): Limited activity within patient's tolerance;Monitored during session;Premedicated before session;Repositioned;Ice applied    Home  Living                      Prior Function            PT Goals (current goals can now be found in the care plan section)  Acute Rehab PT Goals Patient Stated Goal: get some rehab then go home PT Goal Formulation: With patient Time For Goal Achievement: 10/29/19 Potential to Achieve Goals: Good Progress towards PT goals: Progressing toward goals    Frequency    Min 3X/week      PT Plan Current plan remains appropriate    Co-evaluation              AM-PAC PT "6 Clicks" Mobility   Outcome Measure  Help needed turning from your back to your side while in a flat bed without using bedrails?: A Little Help needed moving from lying on your back to sitting on the side of a flat bed without using bedrails?: A Little Help needed moving to and from a bed to a chair (including a wheelchair)?: A Little Help needed standing up from a chair using your arms (e.g., wheelchair or bedside chair)?: A Little Help needed to walk in hospital room?: Total Help needed climbing 3-5 steps with a railing? : Total 6 Click Score: 14    End of Session Equipment Utilized During Treatment: Gait belt;Right knee immobilizer Activity Tolerance: Patient tolerated treatment well;Patient limited by pain Patient left: in chair;with call bell/phone within reach;with chair alarm set Nurse Communication: Mobility status;Other (comment) (soiled bedding) PT Visit Diagnosis: Unsteadiness on feet (R26.81);Other abnormalities of gait and mobility (R26.89);Muscle weakness (generalized) (M62.81);Pain Pain - Right/Left: Right Pain - part of body: Leg     Time: 0825-0903 PT Time Calculation (min) (ACUTE ONLY): 38 min  Charges:  $Therapeutic Exercise: 23-37 mins $Therapeutic Activity: 8-22 mins                      Tori Faheem Ziemann PT, DPT 10/19/19, 10:01 AM

## 2019-10-20 ENCOUNTER — Non-Acute Institutional Stay (SKILLED_NURSING_FACILITY): Payer: Medicare Other | Admitting: Adult Health

## 2019-10-20 ENCOUNTER — Encounter: Payer: Self-pay | Admitting: Adult Health

## 2019-10-20 DIAGNOSIS — T84116S Breakdown (mechanical) of internal fixation device of bone of right lower leg, sequela: Secondary | ICD-10-CM | POA: Diagnosis not present

## 2019-10-20 DIAGNOSIS — S82401P Unspecified fracture of shaft of right fibula, subsequent encounter for closed fracture with malunion: Secondary | ICD-10-CM

## 2019-10-20 DIAGNOSIS — S82201P Unspecified fracture of shaft of right tibia, subsequent encounter for closed fracture with malunion: Secondary | ICD-10-CM | POA: Diagnosis not present

## 2019-10-20 DIAGNOSIS — K219 Gastro-esophageal reflux disease without esophagitis: Secondary | ICD-10-CM

## 2019-10-20 DIAGNOSIS — D62 Acute posthemorrhagic anemia: Secondary | ICD-10-CM

## 2019-10-20 DIAGNOSIS — I959 Hypotension, unspecified: Secondary | ICD-10-CM | POA: Diagnosis not present

## 2019-10-20 DIAGNOSIS — K5901 Slow transit constipation: Secondary | ICD-10-CM

## 2019-10-20 NOTE — Progress Notes (Signed)
Location:  [facility]/11-8/16/21 for  Nursing Home Room Number: 312-A Place of Service:  SNF (31) Provider:  Kenard Gower, DNP, FNP-BC  Patient Care Team: Burton Apley, MD as PCP - General (Internal Medicine)  Extended Emergency Contact Information Primary Emergency Contact: brown,robert Mobile Phone: [phone] Relation: Friend Secondary Emergency Contact: brown,laura Mobile Phone: [phone] Relation: Friend  Code Status:  FULL CODE  Goals of care: Advanced Directive information Advanced Directives 10/14/2019  Does Patient Have a Medical Advance Directive? No  Would patient like information on creating a medical advance directive? No - Patient declined     Chief Complaint  Patient presents with  . Acute Visit    Patient is seen for hospital followup, status post hospitalization at [facility] 8/11-8/16/21 for tib-fib fracture.    HPI:  Pt is a 64 y.o. female who was admitted to [facility] on 10/19/19 post  [facility] hospitalization 10/14/19 to 10/19/19 for right tibia malunion and hardware failure S/P right tibia fibula osteotomy, takedown malunion, replating on 10/14/19. Hemoglobin dropped from 8.1-6.4 and was transfused 1 unit PRBC on 10/15/2019. She was admitted to [facility] for short-term rehabilitation.  Of note, the first surgery was done by Dr. Berdine Dance, orthopedist in [place].  She consulted another orthopedist in [facility] before she moved to [place] which was Dr. Annabelle Harman who discussed osteotomy correction of her significant tibial nonunion deformity.  She was walking with a long leg brace and has persistent pain problems.  Originally, she stepped on a heating vent and broke her right tibia.  She has a PMH of COPD, carpal tunnel syndrome, GERD, hypertension, migraines and anemia.  She was seen in the room today.  She complained of constipation.  Current pain level on her right leg is 8/10 and had  just taken her PRN Percocet.  Past Medical History:  Diagnosis Date  . Anemia   . Arthritis   . Carpal tunnel syndrome    Bilateral  . COPD (chronic obstructive pulmonary disease) (HCC)   . GERD (gastroesophageal reflux disease)   . History of hiatal hernia   . Hypertension   . Migraines   . Pneumonia   . Tibia/fibula fracture    Right   Past Surgical History:  Procedure Laterality Date  . ABDOMINAL HYSTERECTOMY    . BACK SURGERY     Screws and plates lower back  . CARPAL TUNNEL RELEASE Right   . COLONOSCOPY    . ECTOPIC PREGNANCY SURGERY  1986  . ORIF TIBIA & FIBULA FRACTURES Right   . TIBIA OSTEOTOMY Right 10/14/2019   Procedure: right tibia fibula osteotomy, takedown malunion, replating;  Surgeon: Eldred Manges, MD;  Location: [facility];  Service: Orthopedics;  Laterality: Right;  . UPPER GI ENDOSCOPY      No Known Allergies  Outpatient Encounter Medications as of 10/20/2019  Medication Sig  . aspirin 325 MG tablet Take 1 tablet (325 mg total) by mouth daily.  Marland Kitchen gabapentin (NEURONTIN) 600 MG tablet Take 600 mg by mouth 3 (three) times daily.  Marland Kitchen losartan (COZAAR) 25 MG tablet Take 25 mg by mouth every evening.   . methocarbamol (ROBAXIN) 500 MG tablet Take 1 tablet (500 mg total) by mouth every 6 (six) hours as needed for muscle spasms.  Marland Kitchen oxyCODONE-acetaminophen (PERCOCET/ROXICET) 5-325 MG tablet Take 1 tablet by mouth every 6 (six) hours as needed for severe pain.  . pantoprazole (PROTONIX) 40 MG tablet Take 40 mg by mouth daily.  . [DISCONTINUED] oxyCODONE-acetaminophen (  PERCOCET) 5-325 MG tablet Take 1 tablet by mouth every 4 (four) hours as needed for severe pain.   No facility-administered encounter medications on file as of 10/20/2019.    Review of Systems  GENERAL: No change in appetite, no fatigue, no weight changes, no fever, chills or weakness MOUTH and THROAT: Denies oral discomfort, gingival pain or bleeding RESPIRATORY: no cough, SOB, DOE, wheezing,  hemoptysis CARDIAC: No chest pain, edema or palpitations GI: +constipation GU: Denies dysuria, frequency, hematuria, incontinence, or discharge NEUROLOGICAL: Denies dizziness, syncope, numbness, or headache PSYCHIATRIC: Denies feelings of depression or anxiety. No report of hallucinations, insomnia, paranoia, or agitation   Pertinent  Health Maintenance Due  Topic Date Due  . PAP SMEAR-Modifier  Never done  . MAMMOGRAM  Never done  . COLONOSCOPY  Never done  . DEXA SCAN  Never done  . PNA vac Low Risk Adult (1 of 2 - PCV13) Never done  . INFLUENZA VACCINE  10/04/2019    Vitals:   10/20/19 1010  BP: 102/65  Pulse: 78  Resp: 20  Temp: 97.7 F (36.5 C)  TempSrc: Oral  Weight: 129 lb 15.7 oz (59 kg)  Height: 4\' 8"  (1.422 m)   Body mass index is 29.14 kg/m.  Physical Exam  GENERAL APPEARANCE: Well nourished. In no acute distress. Normal body habitus SKIN:  Right leg surgical incision with staples, dry MOUTH and THROAT: Lips are without lesions. Oral mucosa is moist and without lesions. Tongue is normal in shape, size, and color and without lesions RESPIRATORY: Breathing is even & unlabored, BS CTAB CARDIAC: RRR, no murmur,no extra heart sounds, no edema GI: Abdomen soft, normal BS, no masses, no tenderness EXTREMITIES: Right leg with splint NEUROLOGICAL: There is no tremor. Speech is clear. Alert and oriented X 3. PSYCHIATRIC:  Affect and behavior are appropriate  Labs reviewed: Recent Labs    10/14/19 1020 10/15/19 0256  NA 134* 131*  K 4.1 4.2  CL 105 105  CO2 21* 20*  GLUCOSE 81 136*  BUN 14 17  CREATININE 0.89 0.80  CALCIUM 8.5* 8.0*   Recent Labs    10/14/19 1020  AST 18  ALT 11  ALKPHOS 70  BILITOT 0.2*  PROT 7.1  ALBUMIN 3.6   Recent Labs    10/14/19 1020 10/15/19 0256 10/16/19 0313  WBC 6.6 7.3 9.6  HGB 8.1* 6.4* 7.1*  HCT 26.6* 20.9* 23.5*  MCV 77.3* 77.1* 79.9*  PLT 350 287 275    Significant Diagnostic Results in last 30 days:  DG  Tibia/Fibula Right  Result Date: 10/14/2019 CLINICAL DATA:  Elective surgery EXAM: RIGHT TIBIA AND FIBULA - 2 VIEW COMPARISON:  04/01/2019 FINDINGS: Revision of the plate and screw fixation device within the right tibia with improved alignment across the old healed proximal right tibial fracture. No hardware complicating feature. IMPRESSION: As above. Electronically Signed   By: 04/03/2019 M.D.   On: 10/14/2019 15:53   DG C-Arm 1-60 Min-No Report  Result Date: 10/14/2019 Fluoroscopy was utilized by the requesting physician.  No radiographic interpretation.    Assessment/Plan  1. Mechanical breakdown of internal fixation device of bone of right lower leg, sequela Tibia/fibula fracture, right, closed, with malunion, subsequent encounter -  S/P right tibia fibula osteotomy, takedown malunion, replating -NWB on RLE, continue aspirin EC 325 mg 1 tab daily for a total of 30 days for DVT prophylaxis, methocarbamol 500 mg 1 tab every 6 hours PRN for muscle spasm, Gabapentin 600 mg 1 tab 3 times  a day and Percocet 5-325 mg 1 tab every 6 hours PRN for pain -Follow-up with orthopedics, Dr. Ophelia Charter, on 10/27/2019  2. Hypotension -  BP 102/65, will discontinue Losartan and monitor BP  3. Gastroesophageal reflux disease without esophagitis -Stable, continue pantoprazole 40 mg 1 tab daily  4. Anemia associated with acute blood loss Lab Results  Component Value Date   HGB 7.1 (L) 10/16/2019   -  S/P transfusion of 1 unit PRBC on 10/15/2019 -  Start ferrous sulfate 325 mg 1 tab twice a day -  will recheck CBC  5. Slow transit constipation -Start senna -S 8.6/50 mg 2 tabs twice a day     Family/ staff Communication: Discussed plan of care with resident and charge nurse.  Labs/tests ordered: None CBC and BMP  Goals of care:   Short-term care   Kenard Gower, DNP, MSN, FNP-BC [facility] [phone] (Monday-Friday 8:00 a.m. - 5:00 p.m.) [phone]  (after hours)

## 2019-10-21 LAB — BASIC METABOLIC PANEL
BUN: 11 (ref 4–21)
CO2: 22 (ref 13–22)
Chloride: 103 (ref 99–108)
Creatinine: 0.5 (ref 0.5–1.1)
Glucose: 84
Potassium: 4.1 (ref 3.4–5.3)
Sodium: 138 (ref 137–147)

## 2019-10-21 LAB — CBC AND DIFFERENTIAL
HCT: 23 — AB (ref 36–46)
Hemoglobin: 7.2 — AB (ref 12.0–16.0)
Neutrophils Absolute: 3
Platelets: 374 (ref 150–399)
WBC: 7.7

## 2019-10-21 LAB — CBC: RBC: 2.98 — AB (ref 3.87–5.11)

## 2019-10-21 LAB — COMPREHENSIVE METABOLIC PANEL
Calcium: 8.9 (ref 8.7–10.7)
GFR calc Af Amer: 90
GFR calc non Af Amer: 90

## 2019-10-27 ENCOUNTER — Encounter: Payer: Self-pay | Admitting: Orthopaedic Surgery

## 2019-10-27 ENCOUNTER — Ambulatory Visit (INDEPENDENT_AMBULATORY_CARE_PROVIDER_SITE_OTHER): Payer: Medicare Other

## 2019-10-27 ENCOUNTER — Ambulatory Visit (INDEPENDENT_AMBULATORY_CARE_PROVIDER_SITE_OTHER): Payer: Medicare Other | Admitting: Orthopaedic Surgery

## 2019-10-27 ENCOUNTER — Non-Acute Institutional Stay (SKILLED_NURSING_FACILITY): Payer: Medicare Other | Admitting: Internal Medicine

## 2019-10-27 ENCOUNTER — Encounter: Payer: Self-pay | Admitting: Internal Medicine

## 2019-10-27 VITALS — Ht <= 58 in | Wt 145.0 lb

## 2019-10-27 DIAGNOSIS — R1312 Dysphagia, oropharyngeal phase: Secondary | ICD-10-CM

## 2019-10-27 DIAGNOSIS — K219 Gastro-esophageal reflux disease without esophagitis: Secondary | ICD-10-CM

## 2019-10-27 DIAGNOSIS — M79661 Pain in right lower leg: Secondary | ICD-10-CM | POA: Diagnosis not present

## 2019-10-27 DIAGNOSIS — T84116D Breakdown (mechanical) of internal fixation device of bone of right lower leg, subsequent encounter: Secondary | ICD-10-CM

## 2019-10-27 DIAGNOSIS — D509 Iron deficiency anemia, unspecified: Secondary | ICD-10-CM

## 2019-10-27 DIAGNOSIS — Z86010 Personal history of colonic polyps: Secondary | ICD-10-CM

## 2019-10-27 NOTE — Assessment & Plan Note (Signed)
Triggers for GERD discussed with the patient. Continue PPI.

## 2019-10-27 NOTE — Assessment & Plan Note (Addendum)
Speech therapy evaluation at SNF. Dr. Zenaida Deed, PCP can arrange GI follow-up once orthopedic situation is stable. A copy of this evaluation will be faxed to his office.

## 2019-10-27 NOTE — Assessment & Plan Note (Addendum)
No active bleeding dyscrasias by history. She does describe active oropharyngeal dysphagia. CBC will be monitored GI F/U post discharge

## 2019-10-27 NOTE — Assessment & Plan Note (Addendum)
Critical H/H & need to update GI status discussed with her Dr. Su Hilt can arrange for GI follow-up when she is discharged from the SNF and is orthopedically stable.

## 2019-10-27 NOTE — Progress Notes (Signed)
   Post-Op Visit Note   Patient: Anne Carrillo           Date of Birth: 1955-09-30           MRN: [id number] Visit Date: 10/27/2019 PCP: Burton Apley, MD   Assessment & Plan:  Chief Complaint:  Chief Complaint  Patient presents with  . Right Leg - Routine Post Op    10/14/2019 Right tibia/fibula osteotomy, takedown malunion, plating   Visit Diagnoses:  1. Pain in right lower leg     Plan: Follow-up in 1 week for wound check and possible staple removal.  Patient stressed the importance of being very compliant with foot elevation while above her heart.  Understands the risk of DVT and compartment syndrome.  Follow-Up Instructions: Return in about 1 week (around 11/03/2019) for For wound check and possible staple removal.   Orders:  Orders Placed This Encounter  Procedures  . XR Tibia/Fibula Right   No orders of the defined types were placed in this encounter.   Imaging: XR Tibia/Fibula Right  Result Date: 10/27/2019 X-ray right tibia shows that her hardware is intact.  Satisfactory alignment of the tibia.   PMFS History: Patient Active Problem List   Diagnosis Date Noted  . Microcytic hypochromic anemia 10/27/2019  . GERD (gastroesophageal reflux disease) 10/27/2019  . Tibia/fibula fracture, right, closed, with malunion, subsequent encounter 10/14/2019  . Mechanical breakdown of internal orthopedic device (HCC) 04/01/2019   Past Medical History:  Diagnosis Date  . Anemia   . Arthritis   . Carpal tunnel syndrome    Bilateral  . COPD (chronic obstructive pulmonary disease) (HCC)   . GERD (gastroesophageal reflux disease)   . History of hiatal hernia   . Hypertension   . Migraines   . Pneumonia   . Tibia/fibula fracture    Right    History reviewed. No pertinent family history.  Past Surgical History:  Procedure Laterality Date  . ABDOMINAL HYSTERECTOMY    . BACK SURGERY     Screws and plates lower back  . CARPAL TUNNEL RELEASE Right   . COLONOSCOPY     . ECTOPIC PREGNANCY SURGERY  1986  . ORIF TIBIA & FIBULA FRACTURES Right   . TIBIA OSTEOTOMY Right 10/14/2019   Procedure: right tibia fibula osteotomy, takedown malunion, replating;  Surgeon: Eldred Manges, MD;  Location: [facility];  Service: Orthopedics;  Laterality: Right;  . UPPER GI ENDOSCOPY     Social History   Occupational History  . Not on file  Tobacco Use  . Smoking status: Current Every Day Smoker    Packs/day: 0.25    Years: 50.00    Pack years: 12.50    Types: Cigarettes  . Smokeless tobacco: Never Used  Vaping Use  . Vaping Use: Never used  Substance and Sexual Activity  . Alcohol use: Yes  . Drug use: Never  . Sexual activity: Not on file   Exam Patient has moderate swelling of the right lower leg.  Calf and compartments are tender.  Neurovascular intact.  No focal motor deficits.  Wound looks good.  Staples intact.  No signs of infection.  Small area of serous drainage.

## 2019-10-27 NOTE — Progress Notes (Signed)
NURSING HOME LOCATION:  [facility] ROOM NUMBER:  [id number]  CODE STATUS:  FULL CODE  PCP:  Burton Apley, MD  [address], [address] [place] [address]  This is a comprehensive admission note to [facility] performed on this date less than 30 days from date of admission. Included are preadmission medical/surgical history; reconciled medication list; family history; social history and comprehensive review of systems.  Corrections and additions to the records were documented. Comprehensive physical exam was also performed. Additionally a clinical summary was entered for each active diagnosis pertinent to this admission in the Problem List to enhance continuity of care.  HPI: Patient was hospitalized 8/11-8/16/2021 to address mechanical breakdown of internal orthopedic device in the context of tibia/fibula fracture with malunion complicated by severe unremitting pain affecting sleep, ADL, and work/hobbies.  Right tibia/fibular osteotomy, takedown of the malunion, and replating were performed by Dr. Annell Greening on 8/11. She was discharged to the SNF for rehab with PT/OT. Postop follow-up was today with Dr. Ophelia Charter.  Imaging revealed intact hardware with satisfactory alignment of the tibia.  The risk of DVT and compartmental syndrome was discussed with the patient and the importance of foot elevation while supine was emphasized.  She is to follow-up approximately 8/31 for wound check and possible staple removal.  Past medical and surgical history: Includes essential hypertension, history of tobacco abuse, GERD, history of migraine, COPD,history of colon polyps and chronic anemia. Surgical procedures include TAH, carpal tunnel release, and both upper and lower endoscopic evaluations.  Social history: Social alcohol.She has a fourth of a pack a day smoking history for most of the 50 years of smoking; however, she has smoked intermittently up to 2-1/2-3 packs/day.  Family history: Updated  to include her mother's history of hypertension, dyslipidemia, and "slight stroke".   Review of systems: She states that she was unaware of the anemia until her surgeon informed her while hospitalized. She did receive 1 unit of packed cells by her history.  She states that she has COPD but denies significant cough or sputum production. She states that she may snore but has no knowledge of any apnea. She describes oropharyngeal dysphagia with bread or large pills. She states that she has had her esophagus dilated at least 2 times. She also had a colonoscopy in 2017 which revealed "small polyps". This was performed by Dr. Berline Chough in [place]. She has had no GI follow-up since relocating to [place]. She validates some mood swings but denies significant anxiety or depression.  Constitutional: No fever, significant weight change, fatigue  Eyes: No redness, discharge, pain, vision change ENT/mouth: No nasal congestion, purulent discharge, earache, change in hearing, sore throat  Cardiovascular: No chest pain, palpitations, paroxysmal nocturnal dyspnea, claudication, edema  Respiratory: No hemoptysis, DOE Gastrointestinal: No heartburn, abdominal pain, nausea /vomiting, rectal bleeding, melena, change in bowels Genitourinary: No dysuria, hematuria, pyuria, incontinence, nocturia Musculoskeletal: No joint stiffness, joint swelling, weakness, pain Dermatologic: No rash, pruritus, change in appearance of skin Neurologic: No dizziness, headache, syncope, seizures, numbness, tingling Psychiatric: No insomnia, anorexia Endocrine: No change in hair/skin/nails, excessive thirst, excessive hunger, excessive urination  Hematologic/lymphatic: No significant bruising, lymphadenopathy, abnormal bleeding Allergy/immunology: No itchy/watery eyes, significant sneezing, urticaria, angioedema  Physical exam:  Pertinent or positive findings: Hair is thin. Eyebrows are also thin. She is edentulous. She  peppers her speech with profanity. First heart sound is accentuated at the left base. She has low-grade rhonchi, pops and wheezes, especially in the right lower lobe. Right lower  extremity is in a wrap. Dorsalis pedis pulses are stronger than the posterior tibial pulses but all are palpable. She has mixed DIP/PIP arthritic change of the hands.  Clubbing of the nailbeds suggested.  General appearance: Adequately nourished; no acute distress, increased work of breathing is present.   Lymphatic: No lymphadenopathy about the head, neck, axilla. Eyes: No conjunctival inflammation or lid edema is present. There is no scleral icterus. Ears:  External ear exam shows no significant lesions or deformities.   Nose:  External nasal examination shows no deformity or inflammation. Nasal mucosa are pink and moist without lesions, exudates Oral exam: Lips and gums are healthy appearing.There is no oropharyngeal erythema or exudate. Neck:  No thyromegaly, masses, tenderness noted.    Heart:  Normal rate and regular rhythm. S2 normal without gallop, murmur, click, rub.  Lungs: without wheezes, rales, rubs. Abdomen: Bowel sounds are normal.  Abdomen is soft and nontender with no organomegaly, hernias, masses. GU: Deferred  Extremities:  No cyanosis,edema. Neurologic exam:  Balance, Rhomberg, finger to nose testing could not be completed due to clinical state Skin: Warm & dry w/o tenting. No significant lesions or rash.  See clinical summary under each active problem in the Problem List with associated updated therapeutic plan

## 2019-10-27 NOTE — Patient Instructions (Signed)
See assessment and plan under each diagnosis in the problem list and acutely for this visit 

## 2019-10-28 ENCOUNTER — Encounter: Payer: Self-pay | Admitting: Nurse Practitioner

## 2019-10-28 ENCOUNTER — Non-Acute Institutional Stay (SKILLED_NURSING_FACILITY): Payer: Medicare Other | Admitting: Nurse Practitioner

## 2019-10-28 DIAGNOSIS — D509 Iron deficiency anemia, unspecified: Secondary | ICD-10-CM | POA: Diagnosis not present

## 2019-10-28 DIAGNOSIS — S82201P Unspecified fracture of shaft of right tibia, subsequent encounter for closed fracture with malunion: Secondary | ICD-10-CM | POA: Diagnosis not present

## 2019-10-28 DIAGNOSIS — K219 Gastro-esophageal reflux disease without esophagitis: Secondary | ICD-10-CM | POA: Diagnosis not present

## 2019-10-28 DIAGNOSIS — S82401P Unspecified fracture of shaft of right fibula, subsequent encounter for closed fracture with malunion: Secondary | ICD-10-CM | POA: Diagnosis not present

## 2019-10-28 LAB — CBC AND DIFFERENTIAL
HCT: 26 — AB (ref 36–46)
Hemoglobin: 8.5 — AB (ref 12.0–16.0)
Neutrophils Absolute: 6
Platelets: 420 — AB (ref 150–399)
WBC: 8.5

## 2019-10-28 LAB — CBC: RBC: 3.26 — AB (ref 3.87–5.11)

## 2019-10-28 NOTE — Progress Notes (Signed)
Location:   [facility]  Nursing Home Room Number: 312-A Place of Service:  SNF (31) Provider: Sumayyah Custodio Johnney Ou NP  Patient Care Team: Burton Apley, MD as PCP - General (Internal Medicine)  Extended Emergency Contact Information Primary Emergency Contact: brown,robert Mobile Phone: [phone] Relation: Friend Secondary Emergency Contact: brown,laura Mobile Phone: [phone] Relation: Friend  Code Status:  Full Code Goals of care: Advanced Directive information Advanced Directives 10/28/2019  Does Patient Have a Medical Advance Directive? Yes  Type of Advance Directive (No Data)  Does patient want to make changes to medical advance directive? No - Patient declined  Would patient like information on creating a medical advance directive? -     Chief Complaint  Patient presents with  . Acute Visit    Nausea, Vomiting    HPI:  Pt is a 64 y.o. female seen today for an acute visit for nausea, vomiting today, denied abd pain, constipation, she is afebrile.   Anemia, post op,  improved, Hgb 7.2 10/21/19, 8.5 10/28/19, no active bleeding, takes Fe bid since 10/20/19  Post Op replating 10/14/19 for the R Tibia/fibula fx, s/p mechanical breakdown of internal orthopedic device, takes ASA 325mg  qd, takes Gabapentin for pain  Constipation, takes Senokot S II bid  GERD, takes Pantoprazole 40mg  qd.      Past Medical History:  Diagnosis Date  . Anemia   . Arthritis   . Carpal tunnel syndrome    Bilateral  . COPD (chronic obstructive pulmonary disease) (HCC)   . GERD (gastroesophageal reflux disease)   . History of hiatal hernia   . Hypertension   . Migraines   . Pneumonia   . Tibia/fibula fracture    Right   Past Surgical History:  Procedure Laterality Date  . ABDOMINAL HYSTERECTOMY    . BACK SURGERY     Screws and plates lower back  . CARPAL TUNNEL RELEASE Right   . COLONOSCOPY    . ECTOPIC PREGNANCY SURGERY  1986  . ORIF TIBIA & FIBULA FRACTURES Right   .  TIBIA OSTEOTOMY Right 10/14/2019   Procedure: right tibia fibula osteotomy, takedown malunion, replating;  Surgeon: , MD;  Location: [facility];  Service: Orthopedics;  Laterality: Right;  . UPPER GI ENDOSCOPY      No Known Allergies  Allergies as of 10/28/2019   No Known Allergies     Medication List       Accurate as of October 28, 2019 11:59 PM. If you have any questions, ask your nurse or doctor.        STOP taking these medications   losartan 25 MG tablet Commonly known as: COZAAR Stopped by: Todd Jelinski X [name], NP     TAKE these medications   aspirin 325 MG tablet Take 1 tablet (325 mg total) by mouth daily.   ferrous sulfate 325 (65 FE) MG tablet Take 325 mg by mouth 2 (two) times daily with a meal.   gabapentin 600 MG tablet Commonly known as: NEURONTIN Take 600 mg by mouth 3 (three) times daily.   methocarbamol 500 MG tablet Commonly known as: Robaxin Take 1 tablet (500 mg total) by mouth every 6 (six) hours as needed for muscle spasms.   nicotine 14 mg/24hr patch Commonly known as: NICODERM CQ - dosed in mg/24 hours Place 14 mg onto the skin daily.   oxyCODONE-acetaminophen 5-325 MG tablet Commonly known as: PERCOCET/ROXICET Take 1 tablet by mouth every 6 (six) hours as needed for severe pain.  pantoprazole 40 MG tablet Commonly known as: PROTONIX Take 40 mg by mouth daily.   sennosides-docusate sodium 8.6-50 MG tablet Commonly known as: SENOKOT-S Take 2 tablets by mouth in the morning and at bedtime.       Review of Systems  Constitutional: Positive for appetite change. Negative for chills, diaphoresis, fatigue and fever.  HENT: Positive for trouble swallowing. Negative for congestion and voice change.        Hx of esophageal dilatation x2  Eyes: Negative for visual disturbance.  Respiratory: Negative for cough, shortness of breath and wheezing.   Cardiovascular: Positive for leg swelling. Negative for chest pain and palpitations.    Gastrointestinal: Positive for nausea and vomiting. Negative for abdominal distention, abdominal pain, blood in stool, constipation and diarrhea.  Genitourinary: Negative for difficulty urinating, dysuria and urgency.  Musculoskeletal: Positive for arthralgias and gait problem.  Skin: Negative for color change.  Neurological: Negative for speech difficulty, weakness, light-headedness and headaches.  Psychiatric/Behavioral: Negative for behavioral problems, confusion and sleep disturbance. The patient is not nervous/anxious.      There is no immunization history on file for this patient. Pertinent  Health Maintenance Due  Topic Date Due  . PAP SMEAR-Modifier  Never done  . MAMMOGRAM  Never done  . COLONOSCOPY  Never done  . DEXA SCAN  Never done  . PNA vac Low Risk Adult (1 of 2 - PCV13) Never done  . INFLUENZA VACCINE  10/04/2019   No flowsheet data found. Functional Status Survey:    Vitals:   10/28/19 1451  BP: 124/78  Pulse: 92  Resp: 18  Temp: 97.7 F (36.5 C)  TempSrc: Oral  Weight: 145 lb 12.8 oz (66.1 kg)  Height: 4\' 8"  (1.422 m)   Body mass index is 32.69 kg/m. Physical Exam Constitutional:      General: She is not in acute distress.    Appearance: Normal appearance. She is not ill-appearing, toxic-appearing or diaphoretic.  HENT:     Head: Normocephalic and atraumatic.     Nose: Nose normal.     Mouth/Throat:     Mouth: Mucous membranes are moist.  Cardiovascular:     Rate and Rhythm: Normal rate and regular rhythm.     Heart sounds: No murmur heard.      Comments: Decreased air entry both lungs.  Pulmonary:     Effort: Pulmonary effort is normal.     Breath sounds: Rhonchi present. No wheezing or rales.  Abdominal:     General: Bowel sounds are normal.     Palpations: Abdomen is soft.     Tenderness: There is no abdominal tenderness. There is no right CVA tenderness, left CVA tenderness, guarding or rebound.  Musculoskeletal:     Cervical back:  Normal range of motion and neck supple.     Right lower leg: Edema present.     Left lower leg: No edema.     Comments: Right lower leg is covered in dressing sleeve during my examination. Left arm is shorter than the right.   Skin:    General: Skin is warm and dry.  Neurological:     General: No focal deficit present.     Mental Status: She is alert and oriented to person, place, and time. Mental status is at baseline.     Comments: Gait was not tested during today's visit.   Psychiatric:        Mood and Affect: Mood normal.        Behavior:  Behavior normal.        Thought Content: Thought content normal.        Judgment: Judgment normal.     Labs reviewed: Recent Labs    10/14/19 1020 10/15/19 0256 10/21/19 0000  NA 134* 131* 138  K 4.1 4.2 4.1  CL 105 105 103  CO2 21* 20* 22  GLUCOSE 81 136*  --   BUN 14 17 11   CREATININE 0.89 0.80 0.5  CALCIUM 8.5* 8.0* 8.9   Recent Labs    10/14/19 1020  AST 18  ALT 11  ALKPHOS 70  BILITOT 0.2*  PROT 7.1  ALBUMIN 3.6   Recent Labs    10/14/19 1020 10/14/19 1020 10/15/19 0256 10/15/19 0256 10/16/19 0313 10/21/19 0000 10/28/19 0000  WBC 6.6   < > 7.3   < > 9.6 7.7 8.5  NEUTROABS  --   --   --   --   --  3 6  HGB 8.1*   < > 6.4*   < > 7.1* 7.2* 8.5*  HCT 26.6*   < > 20.9*   < > 23.5* 23* 26*  MCV 77.3*  --  77.1*  --  79.9*  --   --   PLT 350   < > 287   < > 275 374 420*   < > = values in this interval not displayed.   No results found for: TSH No results found for: HGBA1C No results found for: CHOL, HDL, LDLCALC, LDLDIRECT, TRIG, CHOLHDL  Significant Diagnostic Results in last 30 days:  DG Tibia/Fibula Right  Result Date: 10/14/2019 CLINICAL DATA:  Elective surgery EXAM: RIGHT TIBIA AND FIBULA - 2 VIEW COMPARISON:  04/01/2019 FINDINGS: Revision of the plate and screw fixation device within the right tibia with improved alignment across the old healed proximal right tibial fracture. No hardware complicating  feature. IMPRESSION: As above. Electronically Signed   By: 04/03/2019 M.D.   On: 10/14/2019 15:53   DG C-Arm 1-60 Min-No Report  Result Date: 10/14/2019 Fluoroscopy was utilized by the requesting physician.  No radiographic interpretation.   XR Tibia/Fibula Right  Result Date: 10/27/2019 X-ray right tibia shows that her hardware is intact.  Satisfactory alignment of the tibia.   Assessment/Plan GERD (gastroesophageal reflux disease) Nausea, vomiting, no constipation, diarrhea, or abd pain, negative Murphy sign, will reduce Fe to daily since Hgb improved from 7.2 to 8.5 in one week, to eliminate possible GI irritation. ASA also can be contributory. May consider Lipase, Amylase, 10/29/2019 abd if no better.  Continue Pantoprazole, may f/u GI, adding Zofran 4mg  q6hr prn x 1 wk. Observe.  10/28/19 wbc 8.5, neutrophils 64.9%  Tibia/fibula fracture, right, closed, with malunion, subsequent encounter Covered in dressing, sleeve, denied pain. replating 10/14/19 for the R Tibia/fibula fx, s/p mechanical breakdown of internal orthopedic device, takes ASA 325mg  qd. Continue Gabapentin for pain control.    Microcytic hypochromic anemia Reduce Fe to daily to eliminate possible GI irritation, keep close eye on H+H, her preop H 8.1.     Family/ staff Communication: plan of care reviewed with the patient and charge nurse.   Labs/tests ordered: CBC done today  Time spend 35 minutes.

## 2019-10-28 NOTE — Assessment & Plan Note (Signed)
Stable as per Ortho note 8/24 PT/OT @ [facility]

## 2019-10-29 ENCOUNTER — Encounter: Payer: Self-pay | Admitting: Nurse Practitioner

## 2019-10-29 NOTE — Assessment & Plan Note (Addendum)
Nausea, vomiting, no constipation, diarrhea, or abd pain, negative Murphy sign, will reduce Fe to daily since Hgb improved from 7.2 to 8.5 in one week, to eliminate possible GI irritation. ASA also can be contributory. May consider Lipase, Amylase, [place] abd if no better.  Continue Pantoprazole, may f/u GI, adding Zofran 4mg  q6hr prn x 1 wk. Observe.  10/28/19 wbc 8.5, neutrophils 64.9%

## 2019-10-29 NOTE — Assessment & Plan Note (Addendum)
Covered in dressing, sleeve, denied pain. replating 10/14/19 for the R Tibia/fibula fx, s/p mechanical breakdown of internal orthopedic device, takes ASA 325mg  qd. Continue Gabapentin for pain control.

## 2019-10-29 NOTE — Assessment & Plan Note (Signed)
Reduce Fe to daily to eliminate possible GI irritation, keep close eye on H+H, her preop H 8.1.

## 2019-11-02 ENCOUNTER — Other Ambulatory Visit: Payer: Self-pay | Admitting: Adult Health

## 2019-11-02 LAB — CBC AND DIFFERENTIAL
HCT: 31 — AB (ref 36–46)
Hemoglobin: 9.6 — AB (ref 12.0–16.0)
Neutrophils Absolute: 2
Platelets: 410 — AB (ref 150–399)
WBC: 6.3

## 2019-11-02 LAB — CBC: RBC: 3.65 — AB (ref 3.87–5.11)

## 2019-11-02 MED ORDER — OXYCODONE-ACETAMINOPHEN 5-325 MG PO TABS: 1.0000 | ORAL_TABLET | Freq: Four times a day (QID) | ORAL | 0 refills | Status: DC | PRN

## 2019-11-03 ENCOUNTER — Ambulatory Visit (INDEPENDENT_AMBULATORY_CARE_PROVIDER_SITE_OTHER): Payer: Medicare Other | Admitting: Orthopaedic Surgery

## 2019-11-03 ENCOUNTER — Encounter: Payer: Self-pay | Admitting: Orthopaedic Surgery

## 2019-11-03 VITALS — Ht <= 58 in | Wt 145.0 lb

## 2019-11-03 DIAGNOSIS — S82201P Unspecified fracture of shaft of right tibia, subsequent encounter for closed fracture with malunion: Secondary | ICD-10-CM

## 2019-11-03 DIAGNOSIS — S82401P Unspecified fracture of shaft of right fibula, subsequent encounter for closed fracture with malunion: Secondary | ICD-10-CM

## 2019-11-03 NOTE — Progress Notes (Signed)
   Post-Op Visit Note   Patient: Anne Carrillo           Date of Birth: 1955-10-21           MRN: [id number] Visit Date: 11/03/2019 PCP: Burton Apley, MD   Assessment & Plan: Post replaying tibia and fibular osteotomy for 30 degree plus varus.  Swelling down some of the elevation.  We will wait 1 final week to remove the staples recheck 1 week for possible staple removal.  Chief Complaint:  Chief Complaint  Patient presents with  . Right Leg - Follow-up, Wound Check    10/14/2019 Right tibia/fibula osteotomy, takedown malunion, plating   Visit Diagnoses:  1. Tibia/fibula fracture, right, closed, with malunion, subsequent encounter     Plan: Continue knee immobilizer continue elevation.ROV one week  Follow-Up Instructions: No follow-ups on file.   Orders:  No orders of the defined types were placed in this encounter.  No orders of the defined types were placed in this encounter.   Imaging: No results found.  PMFS History: Patient Active Problem List   Diagnosis Date Noted  . Microcytic hypochromic anemia 10/27/2019  . GERD (gastroesophageal reflux disease) 10/27/2019  . Oropharyngeal dysphagia 10/27/2019  . History of colonic polyps 10/27/2019  . Tibia/fibula fracture, right, closed, with malunion, subsequent encounter 10/14/2019  . Mechanical breakdown of internal orthopedic device (HCC) 04/01/2019   Past Medical History:  Diagnosis Date  . Anemia   . Arthritis   . Carpal tunnel syndrome    Bilateral  . COPD (chronic obstructive pulmonary disease) (HCC)   . GERD (gastroesophageal reflux disease)   . History of hiatal hernia   . Hypertension   . Migraines   . Pneumonia   . Tibia/fibula fracture    Right    No family history on file.  Past Surgical History:  Procedure Laterality Date  . ABDOMINAL HYSTERECTOMY    . BACK SURGERY     Screws and plates lower back  . CARPAL TUNNEL RELEASE Right   . COLONOSCOPY    . ECTOPIC PREGNANCY SURGERY  1986  . ORIF  TIBIA & FIBULA FRACTURES Right   . TIBIA OSTEOTOMY Right 10/14/2019   Procedure: right tibia fibula osteotomy, takedown malunion, replating;  Surgeon: Eldred Manges, MD;  Location: [facility];  Service: Orthopedics;  Laterality: Right;  . UPPER GI ENDOSCOPY     Social History   Occupational History  . Not on file  Tobacco Use  . Smoking status: Current Every Day Smoker    Packs/day: 0.25    Years: 50.00    Pack years: 12.50    Types: Cigarettes  . Smokeless tobacco: Never Used  . Tobacco comment: At times she smoked as much as 2-1/2-3 packs/day.  Vaping Use  . Vaping Use: Never used  Substance and Sexual Activity  . Alcohol use: Yes  . Drug use: Never  . Sexual activity: Not on file

## 2019-11-04 ENCOUNTER — Encounter: Payer: Self-pay | Admitting: Adult Health

## 2019-11-04 ENCOUNTER — Non-Acute Institutional Stay (SKILLED_NURSING_FACILITY): Payer: Medicare Other | Admitting: Adult Health

## 2019-11-04 DIAGNOSIS — D509 Iron deficiency anemia, unspecified: Secondary | ICD-10-CM | POA: Diagnosis not present

## 2019-11-04 DIAGNOSIS — S82201P Unspecified fracture of shaft of right tibia, subsequent encounter for closed fracture with malunion: Secondary | ICD-10-CM | POA: Diagnosis not present

## 2019-11-04 DIAGNOSIS — K219 Gastro-esophageal reflux disease without esophagitis: Secondary | ICD-10-CM | POA: Diagnosis not present

## 2019-11-04 DIAGNOSIS — Z720 Tobacco use: Secondary | ICD-10-CM | POA: Diagnosis not present

## 2019-11-04 DIAGNOSIS — S82401P Unspecified fracture of shaft of right fibula, subsequent encounter for closed fracture with malunion: Secondary | ICD-10-CM

## 2019-11-04 NOTE — Progress Notes (Signed)
Location:  [facility] Room Number: 312-A Place of Service:  SNF (31) Provider:  Kenard Gower, DNP, FNP-BC  Patient Care Team: Burton Apley, MD as PCP - General (Internal Medicine)  Extended Emergency Contact Information Primary Emergency Contact: brown,robert Mobile Phone: [phone] Relation: Friend Secondary Emergency Contact: brown,laura Mobile Phone: [phone] Relation: Friend  Code Status:  FULL CODE  Goals of care: Advanced Directive information Advanced Directives 10/28/2019  Does Patient Have a Medical Advance Directive? Yes  Type of Advance Directive (No Data)  Does patient want to make changes to medical advance directive? No - Patient declined  Would patient like information on creating a medical advance directive? -     Chief Complaint  Patient presents with  . Medical Management of Chronic Issues    Short-term rehabilitation visit    HPI:  Pt is a 64 y.o. female seen today for medical management of chronic diseases.  She is a short-term rehabilitation resident of rehabilitation resident of [facility]. She has a PMH of COPD, carpal tunnel syndrome, GERD, hypertension, migraines and anemia.She was seen in her room today. Orthopedics follow up was done yesterday regarding S/P right tibia fibula osteotomy, takedown malunion, replating on 10/14/2019. Staples will possibly be removed in 1 week on next follow up. She continues to use immobilizer on her right leg. Latest hgb 9.6, 11/02/19, which is improved from hgb 7.2, 10/21/19. She takes FeSO4 325 mg daily for anemia.   Past Medical History:  Diagnosis Date  . Anemia   . Arthritis   . Carpal tunnel syndrome    Bilateral  . COPD (chronic obstructive pulmonary disease) (HCC)   . GERD (gastroesophageal reflux disease)   . History of hiatal hernia   . Hypertension   . Migraines   . Pneumonia   . Tibia/fibula fracture    Right   Past Surgical History:    Procedure Laterality Date  . ABDOMINAL HYSTERECTOMY    . BACK SURGERY     Screws and plates lower back  . CARPAL TUNNEL RELEASE Right   . COLONOSCOPY    . ECTOPIC PREGNANCY SURGERY  1986  . ORIF TIBIA & FIBULA FRACTURES Right   . TIBIA OSTEOTOMY Right 10/14/2019   Procedure: right tibia fibula osteotomy, takedown malunion, replating;  Surgeon: Eldred Manges, MD;  Location: [facility];  Service: Orthopedics;  Laterality: Right;  . UPPER GI ENDOSCOPY      No Known Allergies  Outpatient Encounter Medications as of 11/04/2019  Medication Sig  . aspirin 325 MG tablet Take 1 tablet (325 mg total) by mouth daily.  . ferrous sulfate 325 (65 FE) MG tablet Take 325 mg by mouth 2 (two) times daily with a meal.  . gabapentin (NEURONTIN) 600 MG tablet Take 600 mg by mouth 3 (three) times daily.  . methocarbamol (ROBAXIN) 500 MG tablet Take 1 tablet (500 mg total) by mouth every 6 (six) hours as needed for muscle spasms.  . nicotine (NICODERM CQ - DOSED IN MG/24 HOURS) 14 mg/24hr patch Place 14 mg onto the skin daily.  Melene Muller ON 12/02/2019] nicotine (NICODERM CQ - DOSED IN MG/24 HR) 7 mg/24hr patch Place 7 mg onto the skin daily.  . ondansetron (ZOFRAN) 4 MG tablet Take 4 mg by mouth every 8 (eight) hours as needed for nausea or vomiting. x1 week  . oxyCODONE-acetaminophen (PERCOCET/ROXICET) 5-325 MG tablet Take 1 tablet by mouth every 6 (six) hours as needed for severe pain.  . pantoprazole (PROTONIX)  40 MG tablet Take 40 mg by mouth daily.  . sennosides-docusate sodium (SENOKOT-S) 8.6-50 MG tablet Take 2 tablets by mouth in the morning and at bedtime.   No facility-administered encounter medications on file as of 11/04/2019.    Review of Systems  GENERAL: No change in appetite, no fatigue, no weight changes, no fever, chills or weakness MOUTH and THROAT: Denies oral discomfort, gingival pain or bleeding RESPIRATORY: no cough, SOB, DOE, wheezing, hemoptysis CARDIAC: No chest pain, edema or  palpitations GI: No abdominal pain, diarrhea, constipation, heart burn, nausea or vomiting NEUROLOGICAL: Denies dizziness, syncope, numbness, or headache PSYCHIATRIC: Denies feelings of depression or anxiety. No report of hallucinations, insomnia, paranoia, or agitation   There is no immunization history on file for this patient. Pertinent  Health Maintenance Due  Topic Date Due  . PAP SMEAR-Modifier  Never done  . MAMMOGRAM  Never done  . COLONOSCOPY  Never done  . DEXA SCAN  Never done  . PNA vac Low Risk Adult (1 of 2 - PCV13) Never done  . INFLUENZA VACCINE  Never done   No flowsheet data found.   Vitals:   11/04/19 0810  BP: 133/68  Pulse: 77  Resp: (!) 22  Temp: 97.7 F (36.5 C)  TempSrc: Oral  Weight: 145 lb 12.8 oz (66.1 kg)  Height: 4\' 8"  (1.422 m)   Body mass index is 32.69 kg/m.  Physical Exam  GENERAL APPEARANCE: Well nourished. In no acute distress. Obese SKIN:  Right leg surgical incision covered with ACE wrap MOUTH and THROAT: Lips are without lesions. Oral mucosa is moist and without lesions. Tongue is normal in shape, size, and color and without lesions RESPIRATORY: Breathing is even & unlabored, BS CTAB CARDIAC: RRR, no murmur,no extra heart sounds, no edema GI: Abdomen soft, normal BS, no masses, no tenderness Extremities:  Limited ROM on left shoulder NEUROLOGICAL: There is no tremor. Speech is clear. Alert and oriented X 3. PSYCHIATRIC:  Affect and behavior are appropriate  Labs reviewed: Recent Labs    10/14/19 1020 10/15/19 0256 10/21/19 0000  NA 134* 131* 138  K 4.1 4.2 4.1  CL 105 105 103  CO2 21* 20* 22  GLUCOSE 81 136*  --   BUN 14 17 11   CREATININE 0.89 0.80 0.5  CALCIUM 8.5* 8.0* 8.9   Recent Labs    10/14/19 1020  AST 18  ALT 11  ALKPHOS 70  BILITOT 0.2*  PROT 7.1  ALBUMIN 3.6   Recent Labs    10/14/19 1020 10/14/19 1020 10/15/19 0256 10/15/19 0256 10/16/19 0313 10/16/19 0313 10/21/19 0000 10/28/19 0000  11/02/19 0000  WBC 6.6   < > 7.3   < > 9.6  --  7.7 8.5 6.3  NEUTROABS  --   --   --   --   --   --  3 6 2   HGB 8.1*   < > 6.4*   < > 7.1*   < > 7.2* 8.5* 9.6*  HCT 26.6*   < > 20.9*   < > 23.5*   < > 23* 26* 31*  MCV 77.3*  --  77.1*  --  79.9*  --   --   --   --   PLT 350   < > 287   < > 275   < > 374 420* 410*   < > = values in this interval not displayed.   Significant Diagnostic Results in last 30 days:  DG Tibia/Fibula Right  Result Date: 10/14/2019 CLINICAL DATA:  Elective surgery EXAM: RIGHT TIBIA AND FIBULA - 2 VIEW COMPARISON:  04/01/2019 FINDINGS: Revision of the plate and screw fixation device within the right tibia with improved alignment across the old healed proximal right tibial fracture. No hardware complicating feature. IMPRESSION: As above. Electronically Signed   By: Charlett Nose M.D.   On: 10/14/2019 15:53   DG C-Arm 1-60 Min-No Report  Result Date: 10/14/2019 Fluoroscopy was utilized by the requesting physician.  No radiographic interpretation.   XR Tibia/Fibula Right  Result Date: 10/27/2019 X-ray right tibia shows that her hardware is intact.  Satisfactory alignment of the tibia.   Assessment/Plan  1. Tibia/fibula fracture, right, closed, with malunion, subsequent encounter - S/P right tibia fibula osteotomy, takedown malunion, replating on 10/14/2019 -Continue aspirin EC 325 mg 1 tab daily for a total of 30 days, stop date 11/18/2019, for DVT prophylaxis, Percocet 5-325 mg 1 tab every 6 hours PRN for pain and methocarbamol 500 mg every 6 hours PRN for muscle spasm -Follow-up with orthopedics in 1 week for possible staple removal  2. Gastroesophageal reflux disease, unspecified whether esophagitis present -Stable, continue pantoprazole 40 mg 1 tab daily  3. Microcytic hypochromic anemia Lab Results  Component Value Date   HGB 9.6 (A) 11/02/2019   -Improved, continue ferrous sulfate 325 mg 1 tab daily  4. Tobacco abuse -Continue nicotine  patch    Family/ staff Communication: Discussed plan of care with resident and charge nurse.  Labs/tests ordered: None  Goals of care:   Short-term care   Kenard Gower, DNP, MSN, FNP-BC [facility] [phone] (Monday-Friday 8:00 a.m. - 5:00 p.m.) [phone] (after hours)

## 2019-11-05 ENCOUNTER — Non-Acute Institutional Stay (SKILLED_NURSING_FACILITY): Payer: Medicare Other | Admitting: Adult Health

## 2019-11-05 ENCOUNTER — Encounter: Payer: Self-pay | Admitting: Adult Health

## 2019-11-05 DIAGNOSIS — K219 Gastro-esophageal reflux disease without esophagitis: Secondary | ICD-10-CM | POA: Diagnosis not present

## 2019-11-05 DIAGNOSIS — S82201P Unspecified fracture of shaft of right tibia, subsequent encounter for closed fracture with malunion: Secondary | ICD-10-CM | POA: Diagnosis not present

## 2019-11-05 DIAGNOSIS — D509 Iron deficiency anemia, unspecified: Secondary | ICD-10-CM | POA: Diagnosis not present

## 2019-11-05 DIAGNOSIS — Z720 Tobacco use: Secondary | ICD-10-CM

## 2019-11-05 DIAGNOSIS — S82401P Unspecified fracture of shaft of right fibula, subsequent encounter for closed fracture with malunion: Secondary | ICD-10-CM

## 2019-11-05 DIAGNOSIS — G894 Chronic pain syndrome: Secondary | ICD-10-CM

## 2019-11-05 MED ORDER — NICOTINE 14 MG/24HR TD PT24: 14.0000 mg | MEDICATED_PATCH | Freq: Every day | TRANSDERMAL | 0 refills | Status: AC

## 2019-11-05 MED ORDER — FERROUS SULFATE 325 (65 FE) MG PO TABS: 325.0000 mg | ORAL_TABLET | Freq: Two times a day (BID) | ORAL | 0 refills | Status: DC

## 2019-11-05 MED ORDER — PANTOPRAZOLE SODIUM 40 MG PO TBEC: 40.0000 mg | DELAYED_RELEASE_TABLET | Freq: Every day | ORAL | 0 refills | Status: DC

## 2019-11-05 MED ORDER — OXYCODONE-ACETAMINOPHEN 5-325 MG PO TABS: 1.0000 | ORAL_TABLET | Freq: Four times a day (QID) | ORAL | 0 refills | Status: DC | PRN

## 2019-11-05 MED ORDER — GABAPENTIN 600 MG PO TABS: 600.0000 mg | ORAL_TABLET | Freq: Three times a day (TID) | ORAL | 0 refills | Status: AC

## 2019-11-05 MED ORDER — METHOCARBAMOL 500 MG PO TABS: 500.0000 mg | ORAL_TABLET | Freq: Four times a day (QID) | ORAL | 0 refills | Status: DC | PRN

## 2019-11-05 NOTE — Progress Notes (Addendum)
Location:  [facility] Room Number: 215-A Place of Service:  SNF (31) Provider:  Kenard Gower, DNP, FNP-BC  Patient Care Team: Burton Apley, MD as PCP - General (Internal Medicine)  Extended Emergency Contact Information Primary Emergency Contact: brown,robert Mobile Phone: [phone] Relation: Friend Secondary Emergency Contact: brown,laura Mobile Phone: [phone] Relation: Friend  Code Status:  FULL CODE  Goals of care: Advanced Directive information Advanced Directives 10/28/2019  Does Patient Have a Medical Advance Directive? Yes  Type of Advance Directive (No Data)  Does patient want to make changes to medical advance directive? No - Patient declined  Would patient like information on creating a medical advance directive? -     Chief Complaint  Patient presents with  . Discharge Note    Patient seen for discharge from SNF    HPI:   11/11/19 Addendum:    Patient was supposedly for discharge home but continued her stay in the facility instead. Home safety evaluation was conducted by PT. It was reported that discharge is not feasible until she is weightbearing. She is currently nonweightbearing on her right lower extremity. Her wheelchair does not fit through the door in her house. She needs to be able to use crutches to walk through the doorway. She will need to stay at [facility].    Pt is a 64 y.o. female seen today for discharge home with home health PT and OT.  She was admitted to [facility] 10/19/19 post [facility] hospitalization 10/14/2019 to 10/19/2019 for right tibia malunion and hardware failure S/P right tibia fibula osteotomy, takedown malunion, replating on 10/14/2019.  Hemoglobin dropped from 8.1 to 6.4 so she was transfused 1 unit PRBC on 10/15/2019.  Of note, the first surgery was done by Dr. Berdine Dance, orthopedist in [place], [place].  She consulted another  orthopedist in [place], [place] before she moved to [place] which was Dr. Annabelle Harman who discussed osteotomy correction of her significant tibial nonunion deformity. She was walking with a long leg brace and has persistent pain problems.  Originally, she stepped on a heating vent and broke her right tibia.  She has a PMH of COPD, carpal tunnel syndrome, GERD, hypertension, migraines and anemia.   Past Medical History:  Diagnosis Date  . Anemia   . Arthritis   . Carpal tunnel syndrome    Bilateral  . COPD (chronic obstructive pulmonary disease) (HCC)   . GERD (gastroesophageal reflux disease)   . History of hiatal hernia   . Hypertension   . Migraines   . Pneumonia   . Tibia/fibula fracture    Right   Past Surgical History:  Procedure Laterality Date  . ABDOMINAL HYSTERECTOMY    . BACK SURGERY     Screws and plates lower back  . CARPAL TUNNEL RELEASE Right   . COLONOSCOPY    . ECTOPIC PREGNANCY SURGERY  1986  . ORIF TIBIA & FIBULA FRACTURES Right   . TIBIA OSTEOTOMY Right 10/14/2019   Procedure: right tibia fibula osteotomy, takedown malunion, replating;  Surgeon: Eldred Manges, MD;  Location: [facility];  Service: Orthopedics;  Laterality: Right;  . UPPER GI ENDOSCOPY      No Known Allergies  Outpatient Encounter Medications as of 11/05/2019  Medication Sig  . aspirin 325 MG tablet Take 1 tablet (325 mg total) by mouth daily.  . ferrous sulfate 325 (65 FE) MG tablet Take 325 mg by mouth 2 (two) times daily with a meal.  . gabapentin (  NEURONTIN) 600 MG tablet Take 600 mg by mouth 3 (three) times daily.  . methocarbamol (ROBAXIN) 500 MG tablet Take 1 tablet (500 mg total) by mouth every 6 (six) hours as needed for muscle spasms.  . nicotine (NICODERM CQ - DOSED IN MG/24 HOURS) 14 mg/24hr patch Place 14 mg onto the skin daily.  Melene Muller ON 12/02/2019] nicotine (NICODERM CQ - DOSED IN MG/24 HR) 7 mg/24hr patch Place 7 mg onto the skin daily.  . ondansetron (ZOFRAN) 4 MG tablet  Take 4 mg by mouth every 8 (eight) hours as needed for nausea or vomiting. x1 week  . oxyCODONE-acetaminophen (PERCOCET/ROXICET) 5-325 MG tablet Take 1 tablet by mouth every 6 (six) hours as needed for severe pain.  . pantoprazole (PROTONIX) 40 MG tablet Take 40 mg by mouth daily.  . sennosides-docusate sodium (SENOKOT-S) 8.6-50 MG tablet Take 2 tablets by mouth in the morning and at bedtime.   No facility-administered encounter medications on file as of 11/05/2019.    Review of Systems  GENERAL: No change in appetite, no fatigue, no weight changes, no fever, chills or weakness MOUTH and THROAT: Denies oral discomfort, gingival pain or bleeding RESPIRATORY: no cough, SOB, DOE, wheezing, hemoptysis CARDIAC: No chest pain, edema or palpitations GI: No abdominal pain, diarrhea, constipation, heart burn, nausea or vomiting GU: Denies dysuria, frequency, hematuria, incontinence, or discharge NEUROLOGICAL: Denies dizziness, syncope, numbness, or headache PSYCHIATRIC: Denies feelings of depression or anxiety. No report of hallucinations, insomnia, paranoia, or agitation   Pertinent  Health Maintenance Due  Topic Date Due  . PAP SMEAR-Modifier  Never done  . MAMMOGRAM  Never done  . COLONOSCOPY  Never done  . DEXA SCAN  Never done  . PNA vac Low Risk Adult (1 of 2 - PCV13) Never done  . INFLUENZA VACCINE  Never done    Vitals:   11/05/19 1049  BP: 128/85  Pulse: 79  Resp: 17  Temp: (!) 97.2 F (36.2 C)  TempSrc: Oral  Weight: 145 lb 12.8 oz (66.1 kg)  Height: 4\' 8"  (1.422 m)   Body mass index is 32.69 kg/m.  Physical Exam  GENERAL APPEARANCE: Well nourished. In no acute distress. Obese SKIN:  Right leg surgical incision with staples, ACE wrap and immobilizer MOUTH and THROAT: Lips are without lesions. Oral mucosa is moist and without lesions. Tongue is normal in shape, size, and color and without lesions RESPIRATORY: Breathing is even & unlabored, BS CTAB CARDIAC: RRR, no  murmur,no extra heart sounds, no edema GI: Abdomen soft, normal BS, no masses, no tenderness NEUROLOGICAL: There is no tremor. Speech is clear. Alert and oriented X 3. PSYCHIATRIC:  Affect and behavior are appropriate  Labs reviewed: Recent Labs    10/14/19 1020 10/15/19 0256 10/21/19 0000  NA 134* 131* 138  K 4.1 4.2 4.1  CL 105 105 103  CO2 21* 20* 22  GLUCOSE 81 136*  --   BUN 14 17 11   CREATININE 0.89 0.80 0.5  CALCIUM 8.5* 8.0* 8.9   Recent Labs    10/14/19 1020  AST 18  ALT 11  ALKPHOS 70  BILITOT 0.2*  PROT 7.1  ALBUMIN 3.6   Recent Labs    10/14/19 1020 10/14/19 1020 10/15/19 0256 10/15/19 0256 10/16/19 0313 10/16/19 0313 10/21/19 0000 10/28/19 0000 11/02/19 0000  WBC 6.6   < > 7.3   < > 9.6  --  7.7 8.5 6.3  NEUTROABS  --   --   --   --   --   --  3 6 2   HGB 8.1*   < > 6.4*   < > 7.1*   < > 7.2* 8.5* 9.6*  HCT 26.6*   < > 20.9*   < > 23.5*   < > 23* 26* 31*  MCV 77.3*  --  77.1*  --  79.9*  --   --   --   --   PLT 350   < > 287   < > 275   < > 374 420* 410*   < > = values in this interval not displayed.   Significant Diagnostic Results in last 30 days:  DG Tibia/Fibula Right  Result Date: 10/14/2019 CLINICAL DATA:  Elective surgery EXAM: RIGHT TIBIA AND FIBULA - 2 VIEW COMPARISON:  04/01/2019 FINDINGS: Revision of the plate and screw fixation device within the right tibia with improved alignment across the old healed proximal right tibial fracture. No hardware complicating feature. IMPRESSION: As above. Electronically Signed   By: 04/03/2019 M.D.   On: 10/14/2019 15:53   DG C-Arm 1-60 Min-No Report  Result Date: 10/14/2019 Fluoroscopy was utilized by the requesting physician.  No radiographic interpretation.   XR Tibia/Fibula Right  Result Date: 10/27/2019 X-ray right tibia shows that her hardware is intact.  Satisfactory alignment of the tibia.   Assessment/Plan  1. Tibia/fibula fracture, right, closed, with malunion, subsequent  encounter -  Will follow up with Dr. 10/29/2019, orthopedics, on 11/10/19 for possible staple removal -Nonweightbearing to right leg - methocarbamol (ROBAXIN) 500 MG tablet; Take 1 tablet (500 mg total) by mouth every 6 (six) hours as needed for muscle spasms.  Dispense: 60 tablet; Refill: 0 - oxyCODONE-acetaminophen (PERCOCET/ROXICET) 5-325 MG tablet; Take 1 tablet by mouth every 6 (six) hours as needed for severe pain.  Dispense: 30 tablet; Refill: 0  2. Microcytic hypochromic anemia Lab Results  Component Value Date   HGB 9.6 (A) 11/02/2019   - ferrous sulfate 325 (65 FE) MG tablet; Take 1 tablet (325 mg total) by mouth 2 (two) times daily with a meal.  Dispense: 60 tablet; Refill: 0  3. Tobacco abuse - nicotine (NICODERM CQ - DOSED IN MG/24 HOURS) 14 mg/24hr patch; Place 1 patch (14 mg total) onto the skin daily for 28 days.  Dispense: 28 patch; Refill: 0  4. Gastroesophageal reflux disease, unspecified whether esophagitis present - pantoprazole (PROTONIX) 40 MG tablet; Take 1 tablet (40 mg total) by mouth daily.  Dispense: 30 tablet; Refill: 0  5. Chronic pain syndrome - gabapentin (NEURONTIN) 600 MG tablet; Take 1 tablet (600 mg total) by mouth 3 (three) times daily.  Dispense: 90 tablet; Refill: 0    I have filled out patient's discharge paperwork and e-prescribed medications. Patient will have home health PT and OT.  DME provided:  3-in-1  Total discharge time: Greater than 30 minutes Greater than 50% was spent in counseling and coordination of care.  Discharge time involved coordination of the discharge process with social worker, nursing staff and therapy department. Medical justification for home health services/DME verified.    11/04/2019, DNP, MSN, FNP-BC [facility] [phone] (Monday-Friday 8:00 a.m. - 5:00 p.m.) [phone] (after hours)

## 2019-11-10 ENCOUNTER — Telehealth: Payer: Self-pay

## 2019-11-10 ENCOUNTER — Ambulatory Visit (INDEPENDENT_AMBULATORY_CARE_PROVIDER_SITE_OTHER): Payer: Medicare Other | Admitting: Orthopaedic Surgery

## 2019-11-10 DIAGNOSIS — T84116D Breakdown (mechanical) of internal fixation device of bone of right lower leg, subsequent encounter: Secondary | ICD-10-CM

## 2019-11-10 DIAGNOSIS — S82401P Unspecified fracture of shaft of right fibula, subsequent encounter for closed fracture with malunion: Secondary | ICD-10-CM

## 2019-11-10 DIAGNOSIS — S82201P Unspecified fracture of shaft of right tibia, subsequent encounter for closed fracture with malunion: Secondary | ICD-10-CM

## 2019-11-10 NOTE — Telephone Encounter (Signed)
Yes still [facility]B. [facility] thanks

## 2019-11-10 NOTE — Progress Notes (Signed)
   Post-Op Visit Note   Patient: Anne Carrillo           Date of Birth: [birth year]           MRN: [id number] Visit Date: 11/10/2019 PCP: Burton Apley, MD   Assessment & Plan: Postop tib-fib osteotomy replating for malunion.  Since last week she has some increased swelling has Ace wrap that is been applied and needs to keep her leg elevated more.  Staples are harvested.  There is no drainage.  Chief Complaint: No chief complaint on file.  Visit Diagnoses:  1. Tibia/fibula fracture, right, closed, with malunion, subsequent encounter   2. Mechanical breakdown of internal fixation device of bone of right lower leg, subsequent encounter     Plan: Continue elevation recheck 2 weeks.  Patient lives alone and remains in a skilled facility.  Follow-Up Instructions: No follow-ups on file.   Orders:  No orders of the defined types were placed in this encounter.  No orders of the defined types were placed in this encounter.   Imaging: No results found.  PMFS History: Patient Active Problem List   Diagnosis Date Noted  . Microcytic hypochromic anemia 10/27/2019  . GERD (gastroesophageal reflux disease) 10/27/2019  . Oropharyngeal dysphagia 10/27/2019  . History of colonic polyps 10/27/2019  . Tibia/fibula fracture, right, closed, with malunion, subsequent encounter 10/14/2019  . Mechanical breakdown of internal orthopedic device (HCC) 04/01/2019   Past Medical History:  Diagnosis Date  . Anemia   . Arthritis   . Carpal tunnel syndrome    Bilateral  . COPD (chronic obstructive pulmonary disease) (HCC)   . GERD (gastroesophageal reflux disease)   . History of hiatal hernia   . Hypertension   . Migraines   . Pneumonia   . Tibia/fibula fracture    Right    No family history on file.  Past Surgical History:  Procedure Laterality Date  . ABDOMINAL HYSTERECTOMY    . BACK SURGERY     Screws and plates lower back  . CARPAL TUNNEL RELEASE Right   . COLONOSCOPY    . ECTOPIC  PREGNANCY SURGERY  1986  . ORIF TIBIA & FIBULA FRACTURES Right   . TIBIA OSTEOTOMY Right 10/14/2019   Procedure: right tibia fibula osteotomy, takedown malunion, replating;  Surgeon: Eldred Manges, MD;  Location: [facility];  Service: Orthopedics;  Laterality: Right;  . UPPER GI ENDOSCOPY     Social History   Occupational History  . Not on file  Tobacco Use  . Smoking status: Current Every Day Smoker    Packs/day: 0.25    Years: 50.00    Pack years: 12.50    Types: Cigarettes  . Smokeless tobacco: Never Used  . Tobacco comment: At times she smoked as much as 2-1/2-3 packs/day.  Vaping Use  . Vaping Use: Never used  Substance and Sexual Activity  . Alcohol use: Yes  . Drug use: Never  . Sexual activity: Not on file

## 2019-11-10 NOTE — Telephone Encounter (Signed)
Patient called she wants to know if no weight bearing still applies call back [phone]

## 2019-11-10 NOTE — Telephone Encounter (Signed)
I called and sw pt to advise of message below to call with any other questions.

## 2019-11-10 NOTE — Telephone Encounter (Signed)
Pt was evaluated in the office today and wants to know if she is to remain non weight bearing.

## 2019-11-11 LAB — CBC AND DIFFERENTIAL
HCT: 31 — AB (ref 36–46)
Hemoglobin: 10 — AB (ref 12.0–16.0)
Neutrophils Absolute: 2
Platelets: 409 — AB (ref 150–399)
WBC: 6.4

## 2019-11-11 LAB — CBC: RBC: 3.7 — AB (ref 3.87–5.11)

## 2019-11-11 NOTE — Addendum Note (Signed)
Addended by: Kenard Gower C on: 11/11/2019 02:06 PM   Modules accepted: Level of Service

## 2019-11-12 ENCOUNTER — Other Ambulatory Visit: Payer: Self-pay | Admitting: Internal Medicine

## 2019-11-12 MED ORDER — OXYCODONE-ACETAMINOPHEN 5-325 MG PO TABS: 1.0000 | ORAL_TABLET | Freq: Three times a day (TID) | ORAL | Status: DC | PRN

## 2019-11-14 ENCOUNTER — Other Ambulatory Visit: Payer: Self-pay | Admitting: Adult Health

## 2019-11-14 MED ORDER — OXYCODONE-ACETAMINOPHEN 5-325 MG PO TABS: 1.0000 | ORAL_TABLET | Freq: Four times a day (QID) | ORAL | 0 refills | Status: DC | PRN

## 2019-11-19 ENCOUNTER — Non-Acute Institutional Stay (SKILLED_NURSING_FACILITY): Payer: Medicare Other | Admitting: Adult Health

## 2019-11-19 ENCOUNTER — Encounter: Payer: Self-pay | Admitting: Adult Health

## 2019-11-19 DIAGNOSIS — S82401P Unspecified fracture of shaft of right fibula, subsequent encounter for closed fracture with malunion: Secondary | ICD-10-CM

## 2019-11-19 DIAGNOSIS — G894 Chronic pain syndrome: Secondary | ICD-10-CM

## 2019-11-19 DIAGNOSIS — D509 Iron deficiency anemia, unspecified: Secondary | ICD-10-CM | POA: Diagnosis not present

## 2019-11-19 DIAGNOSIS — K219 Gastro-esophageal reflux disease without esophagitis: Secondary | ICD-10-CM | POA: Diagnosis not present

## 2019-11-19 DIAGNOSIS — Z720 Tobacco use: Secondary | ICD-10-CM

## 2019-11-19 DIAGNOSIS — S82201P Unspecified fracture of shaft of right tibia, subsequent encounter for closed fracture with malunion: Secondary | ICD-10-CM

## 2019-11-19 NOTE — Progress Notes (Signed)
Location:  [facility] Room Number: 215-A Place of Service:  SNF (31) Provider:  Kenard Gower, DNP, FNP-BC  Patient Care Team: Burton Apley, MD as PCP - General (Internal Medicine)  Extended Emergency Contact Information Primary Emergency Contact: brown,robert Mobile Phone: [phone] Relation: Friend Secondary Emergency Contact: brown,laura Mobile Phone: [phone] Relation: Friend  Code Status:  FULL CODE  Goals of care: Advanced Directive information Advanced Directives 10/28/2019  Does Patient Have a Medical Advance Directive? Yes  Type of Advance Directive (No Data)  Does patient want to make changes to medical advance directive? No - Patient declined  Would patient like information on creating a medical advance directive? -     Chief Complaint  Patient presents with  . Medical Management of Chronic Issues    Short-term rehabilitation visit    HPI:  Pt is a 64 y.o. female seen today for medical management of chronic diseases. She is a short-term care resident of [facility]. She has a PMH of COPD, carpal tunnel syndrome, GERD, hypertension, migraines and anemia. She was seen in her room today. Latest hgb 10.0 which is up from 9.6 (11/02/19), currently on FeSO4  She is 325 mg daily. She is S/P right tibia fibula osteotomy, takedown malunion, replating on 10/14/19. She continues to have PT, OT and ST. She continues to be nonweight bearing on RLE.   Past Medical History:  Diagnosis Date  . Anemia   . Arthritis   . Carpal tunnel syndrome    Bilateral  . COPD (chronic obstructive pulmonary disease) (HCC)   . GERD (gastroesophageal reflux disease)   . History of hiatal hernia   . Hypertension   . Migraines   . Pneumonia   . Tibia/fibula fracture    Right   Past Surgical History:  Procedure Laterality Date  . ABDOMINAL HYSTERECTOMY    . BACK SURGERY     Screws and plates lower back  . CARPAL TUNNEL  RELEASE Right   . COLONOSCOPY    . ECTOPIC PREGNANCY SURGERY  1986  . ORIF TIBIA & FIBULA FRACTURES Right   . TIBIA OSTEOTOMY Right 10/14/2019   Procedure: right tibia fibula osteotomy, takedown malunion, replating;  Surgeon: Eldred Manges, MD;  Location: [facility];  Service: Orthopedics;  Laterality: Right;  . UPPER GI ENDOSCOPY      No Known Allergies  Outpatient Encounter Medications as of 11/19/2019  Medication Sig  . aspirin 325 MG tablet Take 1 tablet (325 mg total) by mouth daily.  . ferrous sulfate 325 (65 FE) MG tablet Take 1 tablet (325 mg total) by mouth 2 (two) times daily with a meal.  . gabapentin (NEURONTIN) 600 MG tablet Take 1 tablet (600 mg total) by mouth 3 (three) times daily.  . methocarbamol (ROBAXIN) 500 MG tablet Take 1 tablet (500 mg total) by mouth every 6 (six) hours as needed for muscle spasms.  . nicotine (NICODERM CQ - DOSED IN MG/24 HOURS) 14 mg/24hr patch Place 1 patch (14 mg total) onto the skin daily for 28 days.  Melene Muller ON 12/02/2019] nicotine (NICODERM CQ - DOSED IN MG/24 HR) 7 mg/24hr patch Place 7 mg onto the skin daily.  . ondansetron (ZOFRAN) 4 MG tablet Take 4 mg by mouth every 8 (eight) hours as needed for nausea or vomiting. x1 week  . oxyCODONE-acetaminophen (PERCOCET) 5-325 MG tablet Take 1 tablet by mouth every 6 (six) hours as needed for severe pain.  . pantoprazole (PROTONIX) 40 MG tablet  Take 1 tablet (40 mg total) by mouth daily.  . sennosides-docusate sodium (SENOKOT-S) 8.6-50 MG tablet Take 2 tablets by mouth in the morning and at bedtime.   No facility-administered encounter medications on file as of 11/19/2019.    Review of Systems  GENERAL: No change in appetite, no fatigue, no weight changes, no fever, chills or weakness MOUTH and THROAT: Denies oral discomfort, gingival pain or bleeding RESPIRATORY: no cough, SOB, DOE, wheezing, hemoptysis CARDIAC: No chest pain, edema or palpitations GI: No abdominal pain, diarrhea, constipation,  heart burn, nausea or vomiting GU: Denies dysuria, frequency, hematuria, incontinence, or discharge NEUROLOGICAL: Denies dizziness, syncope, numbness, or headache PSYCHIATRIC: Denies feelings of depression or anxiety. No report of hallucinations, insomnia, paranoia, or agitation    There is no immunization history on file for this patient. Pertinent  Health Maintenance Due  Topic Date Due  . PAP SMEAR-Modifier  Never done  . MAMMOGRAM  Never done  . COLONOSCOPY  Never done  . DEXA SCAN  Never done  . PNA vac Low Risk Adult (1 of 2 - PCV13) Never done  . INFLUENZA VACCINE  Never done    Vitals:   11/19/19 1228  BP: 128/74  Pulse: 74  Resp: 19  Temp: 97.9 F (36.6 C)  TempSrc: Oral  Weight: 147 lb 12.8 oz (67 kg)  Height: 4\' 8"  (1.422 m)   Body mass index is 33.14 kg/m.  Physical Exam  GENERAL APPEARANCE: Well nourished. In no acute distress. Obese SKIN:  Right leg surgical incision  covered with dressing, no erythema MOUTH and THROAT: Lips are without lesions. Oral mucosa is moist and without lesions. Tongue is normal in shape, size, and color and without lesions RESPIRATORY: Breathing is even & unlabored, BS CTAB CARDIAC: RRR, no murmur,no extra heart sounds, no edema GI: Abdomen soft, normal BS, no masses, no tenderness NEUROLOGICAL: There is no tremor. Speech is clear. Alert and oriented X 3. PSYCHIATRIC:  Affect and behavior are appropriate  Labs reviewed: Recent Labs    10/14/19 1020 10/15/19 0256 10/21/19 0000  NA 134* 131* 138  K 4.1 4.2 4.1  CL 105 105 103  CO2 21* 20* 22  GLUCOSE 81 136*  --   BUN 14 17 11   CREATININE 0.89 0.80 0.5  CALCIUM 8.5* 8.0* 8.9   Recent Labs    10/14/19 1020  AST 18  ALT 11  ALKPHOS 70  BILITOT 0.2*  PROT 7.1  ALBUMIN 3.6   Recent Labs    10/14/19 1020 10/14/19 1020 10/15/19 0256 10/15/19 0256 10/16/19 0313 10/16/19 0313 10/21/19 0000 10/28/19 0000 11/02/19 0000  WBC 6.6   < > 7.3   < > 9.6  --  7.7 8.5  6.3  NEUTROABS  --   --   --   --   --   --  3 6 2   HGB 8.1*   < > 6.4*   < > 7.1*   < > 7.2* 8.5* 9.6*  HCT 26.6*   < > 20.9*   < > 23.5*   < > 23* 26* 31*  MCV 77.3*  --  77.1*  --  79.9*  --   --   --   --   PLT 350   < > 287   < > 275   < > 374 420* 410*   < > = values in this interval not displayed.    Significant Diagnostic Results in last 30 days:  XR Tibia/Fibula Right  Result Date: 10/27/2019 X-ray right tibia shows that her hardware is intact.  Satisfactory alignment of the tibia.   Assessment/Plan  1. Microcytic hypochromic anemia Lab Results  Component Value Date   HGB 10.0 (A) 11/11/2019   - continue FeSO4  2. Tibia/fibula fracture, right, closed, with malunion, subsequent encounter S/P right tibia fibula osteotomy, takedown malunion, replating on 10/14/19 - NWB on RLE - follow-up with orthopedics -  Continue PT and OT, for therapeutic strengthening exercises - continue PRN Robaxin for muscle spasm and PRN Percocet for pain  3. Gastroesophageal reflux disease, unspecified whether esophagitis present - stable, continue Pantoprazole  4. Chronic pain syndrome - stable, continue Neurontin and PRN Percocet for pain, PRN Robaxin for muscle spasm  5. Tobacco abuse - continue Nicotine patch     Family/ staff Communication:  Discussed plan of care with resident and charge nurse.  Labs/tests ordered:  None  Goals of care:   Short-term care   Kenard Gower, DNP, MSN, FNP-BC [facility] [phone] (Monday-Friday 8:00 a.m. - 5:00 p.m.) [phone] (after hours)

## 2019-11-19 NOTE — Telephone Encounter (Signed)
This encounter was created in error - please disregard.

## 2019-11-24 ENCOUNTER — Ambulatory Visit (INDEPENDENT_AMBULATORY_CARE_PROVIDER_SITE_OTHER): Payer: Medicare HMO | Admitting: Orthopaedic Surgery

## 2019-11-24 ENCOUNTER — Telehealth: Payer: Self-pay | Admitting: Orthopaedic Surgery

## 2019-11-24 ENCOUNTER — Ambulatory Visit: Payer: Self-pay

## 2019-11-24 ENCOUNTER — Other Ambulatory Visit: Payer: Self-pay | Admitting: Adult Health

## 2019-11-24 DIAGNOSIS — S82401P Unspecified fracture of shaft of right fibula, subsequent encounter for closed fracture with malunion: Secondary | ICD-10-CM

## 2019-11-24 DIAGNOSIS — S82201P Unspecified fracture of shaft of right tibia, subsequent encounter for closed fracture with malunion: Secondary | ICD-10-CM

## 2019-11-24 MED ORDER — OXYCODONE-ACETAMINOPHEN 5-325 MG PO TABS
1.0000 | ORAL_TABLET | Freq: Four times a day (QID) | ORAL | 0 refills | Status: AC | PRN
Start: 2019-11-24 — End: ?

## 2019-11-24 NOTE — Progress Notes (Signed)
   Post-Op Visit Note   Patient: Anne Carrillo           Date of Birth: 04/12/1955           MRN: [id number] Visit Date: 11/24/2019 PCP: Burton Apley, MD   Assessment & Plan: Patient remains in a skilled facility.  She needs arrangements to have adult available when she goes home.  She can begin touchdown weightbearing and apply lotion to the leg elevated as she has been doing for swelling.  Discontinue knee immobilizer.  Chief Complaint:  Chief Complaint  Patient presents with  . Right Leg - Follow-up, Routine Post Op    Still swollen and she has had the ace wrap on since her last ov    Visit Diagnoses:  1. Tibia/fibula fracture, right, closed, with malunion, subsequent encounter     Plan: Okay to begin touchdown weightbearing DC knee immobilizer lotion to leg.  Return in 6 weeks 2 view x-rays in 6 weeks.  Follow-Up Instructions: Return in about 6 weeks (around 01/05/2020).   Orders:  Orders Placed This Encounter  Procedures  . XR Tibia/Fibula Right   No orders of the defined types were placed in this encounter.   Imaging: No results found.  PMFS History: Patient Active Problem List   Diagnosis Date Noted  . Microcytic hypochromic anemia 10/27/2019  . GERD (gastroesophageal reflux disease) 10/27/2019  . Oropharyngeal dysphagia 10/27/2019  . History of colonic polyps 10/27/2019  . Tibia/fibula fracture, right, closed, with malunion, subsequent encounter 10/14/2019  . Mechanical breakdown of internal orthopedic device (HCC) 04/01/2019   Past Medical History:  Diagnosis Date  . Anemia   . Arthritis   . Carpal tunnel syndrome    Bilateral  . COPD (chronic obstructive pulmonary disease) (HCC)   . GERD (gastroesophageal reflux disease)   . History of hiatal hernia   . Hypertension   . Migraines   . Pneumonia   . Tibia/fibula fracture    Right    No family history on file.  Past Surgical History:  Procedure Laterality Date  . ABDOMINAL HYSTERECTOMY    .  BACK SURGERY     Screws and plates lower back  . CARPAL TUNNEL RELEASE Right   . COLONOSCOPY    . ECTOPIC PREGNANCY SURGERY  1986  . ORIF TIBIA & FIBULA FRACTURES Right   . TIBIA OSTEOTOMY Right 10/14/2019   Procedure: right tibia fibula osteotomy, takedown malunion, replating;  Surgeon: Eldred Manges, MD;  Location: [facility];  Service: Orthopedics;  Laterality: Right;  . UPPER GI ENDOSCOPY     Social History   Occupational History  . Not on file  Tobacco Use  . Smoking status: Current Every Day Smoker    Packs/day: 0.25    Years: 50.00    Pack years: 12.50    Types: Cigarettes  . Smokeless tobacco: Never Used  . Tobacco comment: At times she smoked as much as 2-1/2-3 packs/day.  Vaping Use  . Vaping Use: Never used  Substance and Sexual Activity  . Alcohol use: Yes  . Drug use: Never  . Sexual activity: Not on file

## 2019-11-24 NOTE — Telephone Encounter (Signed)
E 

## 2019-11-26 ENCOUNTER — Encounter: Payer: Self-pay | Admitting: Internal Medicine

## 2019-11-26 ENCOUNTER — Non-Acute Institutional Stay (SKILLED_NURSING_FACILITY): Payer: Medicare Other | Admitting: Internal Medicine

## 2019-11-26 DIAGNOSIS — F4320 Adjustment disorder, unspecified: Secondary | ICD-10-CM | POA: Insufficient documentation

## 2019-11-26 DIAGNOSIS — G479 Sleep disorder, unspecified: Secondary | ICD-10-CM | POA: Diagnosis not present

## 2019-11-26 DIAGNOSIS — D509 Iron deficiency anemia, unspecified: Secondary | ICD-10-CM

## 2019-11-26 DIAGNOSIS — F4321 Adjustment disorder with depressed mood: Secondary | ICD-10-CM

## 2019-11-26 DIAGNOSIS — R609 Edema, unspecified: Secondary | ICD-10-CM | POA: Insufficient documentation

## 2019-11-26 NOTE — Assessment & Plan Note (Addendum)
I reassured her that she is not having major depression but a normal grief reaction which will require , meditation & prayer for her emotional pain to lessen.  An antidepressant would not be clinically indicated as was documented in 2019.

## 2019-11-26 NOTE — Assessment & Plan Note (Addendum)
Trial of melatonin.  I discussed the role of stimulants such as caffeine and nicotine in sleep disruption.
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
## 2019-11-26 NOTE — Patient Instructions (Signed)
See assessment and plan under each diagnosis in the problem list and acutely for this visit 

## 2019-11-26 NOTE — Assessment & Plan Note (Addendum)
Denna Haggard' sign is negative.   Albumin was low normal at 3.6 on 8/11.  Protein supplementation encouraged. Elevation as much as possible encouraged due to postop state. D- dimer if progressive.

## 2019-11-26 NOTE — Assessment & Plan Note (Addendum)
11/11/2019 hemoglobin/hematocrit improved to 10/31.  She denies any active bleeding dyscrasias

## 2019-11-30 ENCOUNTER — Non-Acute Institutional Stay (SKILLED_NURSING_FACILITY): Payer: Medicare Other | Admitting: Adult Health

## 2019-11-30 DIAGNOSIS — G894 Chronic pain syndrome: Secondary | ICD-10-CM

## 2019-11-30 DIAGNOSIS — D509 Iron deficiency anemia, unspecified: Secondary | ICD-10-CM

## 2019-11-30 DIAGNOSIS — K219 Gastro-esophageal reflux disease without esophagitis: Secondary | ICD-10-CM | POA: Diagnosis not present

## 2019-11-30 DIAGNOSIS — S82201P Unspecified fracture of shaft of right tibia, subsequent encounter for closed fracture with malunion: Secondary | ICD-10-CM

## 2019-11-30 DIAGNOSIS — S82401P Unspecified fracture of shaft of right fibula, subsequent encounter for closed fracture with malunion: Secondary | ICD-10-CM

## 2019-12-02 NOTE — Progress Notes (Signed)
Location:  [facility] Room Number: 215-A Place of Service:  SNF (31) Provider:  Kenard Gower, DNP, FNP-BC  Patient Care Team: Burton Apley, MD as PCP - General (Internal Medicine)  Extended Emergency Contact Information Primary Emergency Contact: brown,robert Mobile Phone: [phone] Relation: Friend Secondary Emergency Contact: brown,laura Mobile Phone: [phone] Relation: Friend  Code Status:  FULL CODE  Goals of care: Advanced Directive information Advanced Directives 10/28/2019  Does Patient Have a Medical Advance Directive? Yes  Type of Advance Directive (No Data)  Does patient want to make changes to medical advance directive? No - Patient declined  Would patient like information on creating a medical advance directive? -     Chief Complaint  Patient presents with  . Medical Management of Chronic Issues    HPI:  Pt is a 64 y.o. female seen today for medical management of chronic diseases. She is a short-term care resident of [facility]. She has a PMH of COPD, carpal tunnel syndrome, GERD, hypertension, migraines and anemia. She was seen today. She stated that she uses Psychologist, forensic at [facility]. She had a Tib/fib osteotomy, malunion takedown, replating on 10/14/19. She is now TDWB to RLE. She stated that her pain is controlled. She currently takes oxycodone-pain 5-325 mg every 6 hours PRN and gabapentin 600 mg TID for pain and methocarbamol 500 mg 6 hours PRN for muscle spasm.  She denies having heartburn.  She takes pantoprazole 40 mg daily for GERD.   Past Medical History:  Diagnosis Date  . Anemia   . Arthritis   . Carpal tunnel syndrome    Bilateral  . COPD (chronic obstructive pulmonary disease) (HCC)   . GERD (gastroesophageal reflux disease)   . History of hiatal hernia   . Hypertension   . Migraines   . Pneumonia   . Tibia/fibula fracture    Right   Past Surgical History:   Procedure Laterality Date  . ABDOMINAL HYSTERECTOMY    . BACK SURGERY     Screws and plates lower back  . CARPAL TUNNEL RELEASE Right   . COLONOSCOPY    . ECTOPIC PREGNANCY SURGERY  1986  . ORIF TIBIA & FIBULA FRACTURES Right   . TIBIA OSTEOTOMY Right 10/14/2019   Procedure: right tibia fibula osteotomy, takedown malunion, replating;  Surgeon: Eldred Manges, MD;  Location: [facility];  Service: Orthopedics;  Laterality: Right;  . UPPER GI ENDOSCOPY      No Known Allergies  Outpatient Encounter Medications as of 11/30/2019  Medication Sig  . ferrous sulfate 325 (65 FE) MG tablet Take 1 tablet (325 mg total) by mouth 2 (two) times daily with a meal.  . gabapentin (NEURONTIN) 600 MG tablet Take 1 tablet (600 mg total) by mouth 3 (three) times daily.  . methocarbamol (ROBAXIN) 500 MG tablet Take 1 tablet (500 mg total) by mouth every 6 (six) hours as needed for muscle spasms.  . nicotine (NICODERM CQ - DOSED IN MG/24 HOURS) 14 mg/24hr patch Place 1 patch (14 mg total) onto the skin daily for 28 days.  . nicotine (NICODERM CQ - DOSED IN MG/24 HR) 7 mg/24hr patch Place 7 mg onto the skin daily.  . ondansetron (ZOFRAN) 4 MG tablet Take 4 mg by mouth every 6 (six) hours as needed for nausea or vomiting. x1 week   . oxyCODONE-acetaminophen (PERCOCET) 5-325 MG tablet Take 1 tablet by mouth every 6 (six) hours as needed for severe pain.  . pantoprazole (PROTONIX) 40  MG tablet Take 1 tablet (40 mg total) by mouth daily.  . sennosides-docusate sodium (SENOKOT-S) 8.6-50 MG tablet Take 2 tablets by mouth in the morning and at bedtime.   No facility-administered encounter medications on file as of 11/30/2019.    Review of Systems  GENERAL: No change in appetite, no fatigue, no weight changes, no fever, chills or weakness MOUTH and THROAT: Denies oral discomfort, gingival pain or bleeding RESPIRATORY: no cough, SOB, DOE, wheezing, hemoptysis CARDIAC: No chest pain, edema or palpitations GI: No abdominal  pain, diarrhea, constipation, heart burn, nausea or vomiting GU: Denies dysuria, frequency, hematuria, incontinence, or discharge NEUROLOGICAL: Denies dizziness, syncope, numbness, or headache PSYCHIATRIC: Denies feelings of depression or anxiety. No report of hallucinations, insomnia, paranoia, or agitation   There is no immunization history on file for this patient. Pertinent  Health Maintenance Due  Topic Date Due  . PAP SMEAR-Modifier  Never done  . MAMMOGRAM  Never done  . COLONOSCOPY  Never done  . DEXA SCAN  Never done  . PNA vac Low Risk Adult (1 of 2 - PCV13) Never done  . INFLUENZA VACCINE  Never done    Vitals:   11/30/19 2040  BP: 138/76  Pulse: 93  Resp: 19  Temp: (!) 97.1 F (36.2 C)  TempSrc: Oral  Weight: 147 lb 12.8 oz (67 kg)  Height: 4\' 8"  (1.422 m)   Body mass index is 33.14 kg/m.  Physical Exam  GENERAL APPEARANCE: Well nourished. In no acute distress. Obese SKIN:  Right lower leg surgical incision has no erythema. MOUTH and THROAT: Lips are without lesions. Oral mucosa is moist and without lesions. Tongue is normal in shape, size, and color and without lesions RESPIRATORY: Breathing is even & unlabored, BS CTAB CARDIAC: RRR, no murmur,no extra heart sounds, no edema GI: Abdomen soft, normal BS, no masses, no tenderness NEUROLOGICAL: There is no tremor. Speech is clear. Alert and oriented X 3. PSYCHIATRIC:  Affect and behavior are appropriate  Labs reviewed: Recent Labs    10/14/19 1020 10/15/19 0256 10/21/19 0000  NA 134* 131* 138  K 4.1 4.2 4.1  CL 105 105 103  CO2 21* 20* 22  GLUCOSE 81 136*  --   BUN 14 17 11   CREATININE 0.89 0.80 0.5  CALCIUM 8.5* 8.0* 8.9   Recent Labs    10/14/19 1020  AST 18  ALT 11  ALKPHOS 70  BILITOT 0.2*  PROT 7.1  ALBUMIN 3.6   Recent Labs    10/14/19 1020 10/14/19 1020 10/15/19 0256 10/15/19 0256 10/16/19 0313 10/21/19 0000 10/28/19 0000 11/02/19 0000 11/11/19 0000  WBC 6.6   < > 7.3   <  > 9.6   < > 8.5 6.3 6.4  NEUTROABS  --   --   --   --   --    < > 6 2 2   HGB 8.1*   < > 6.4*   < > 7.1*   < > 8.5* 9.6* 10.0*  HCT 26.6*   < > 20.9*   < > 23.5*   < > 26* 31* 31*  MCV 77.3*  --  77.1*  --  79.9*  --   --   --   --   PLT 350   < > 287   < > 275   < > 420* 410* 409*   < > = values in this interval not displayed.    Significant Diagnostic Results in last 30 days:  XR Tibia/Fibula  Right  Result Date: 11/24/2019 Post right tib-fib osteotomy takedown none union and plating AP lateral x-rays obtained.  This shows good position of plate and screws good alignment AP and lateral.  There may be some early healing of the fibula.  No hardware loosening. Impression: Satisfactory right tibial malunion takedown with plating.  Some early healing noted.   Assessment/Plan  1. Gastroesophageal reflux disease, unspecified whether esophagitis present -Stable, pantoprazole  2. Tibia/fibula fracture, right, closed, with malunion, subsequent encounter - S/P Tib/fib osteotomy, malunion takedown, replating on 10/14/19 -Continue methocarbamol 500 mg every 6 hours PRN for muscle spasm and oxycodone-acetaminophen 5-325 mg 1 tab every 6 hours PRN for pain - TDWB to RLE -Continue PT and OT, for therapeutic strengthening exercises -Follow-up with orthopedics on 01/05/2020  3. Microcytic hypochromic anemia Lab Results  Component Value Date   HGB 10.0 (A) 11/11/2019   -Stable, continue ferrous sulfate  4. Chronic pain syndrome -Stable, continue oxycodone-acetaminophen PRN and Neurontin     Family/ staff Communication: Discussed plan of care with resident and charge nurse.  Labs/tests ordered: None  Goals of care:   Short-term care   Kenard Gower, DNP, MSN, FNP-BC [facility] [phone] (Monday-Friday 8:00 a.m. - 5:00 p.m.) [phone] (after hours)

## 2020-01-05 ENCOUNTER — Ambulatory Visit (INDEPENDENT_AMBULATORY_CARE_PROVIDER_SITE_OTHER): Payer: Medicare HMO | Admitting: Orthopaedic Surgery

## 2020-01-05 ENCOUNTER — Encounter: Payer: Self-pay | Admitting: Orthopaedic Surgery

## 2020-01-05 ENCOUNTER — Ambulatory Visit (INDEPENDENT_AMBULATORY_CARE_PROVIDER_SITE_OTHER): Payer: Medicare HMO

## 2020-01-05 ENCOUNTER — Other Ambulatory Visit: Payer: Self-pay

## 2020-01-05 ENCOUNTER — Other Ambulatory Visit: Payer: Self-pay | Admitting: Adult Health

## 2020-01-05 VITALS — Ht <= 58 in | Wt 147.0 lb

## 2020-01-05 DIAGNOSIS — S82201P Unspecified fracture of shaft of right tibia, subsequent encounter for closed fracture with malunion: Secondary | ICD-10-CM

## 2020-01-05 DIAGNOSIS — S82401P Unspecified fracture of shaft of right fibula, subsequent encounter for closed fracture with malunion: Secondary | ICD-10-CM

## 2020-01-05 DIAGNOSIS — K219 Gastro-esophageal reflux disease without esophagitis: Secondary | ICD-10-CM

## 2020-01-05 NOTE — Progress Notes (Signed)
Office Visit Note   Patient: Anne Carrillo           Date of Birth: February 28, 1956           MRN: [id number] Visit Date: 01/05/2020              Requested by: Burton Apley, MD [address], [address] [place],  [place] [address] PCP: Burton Apley, MD   Assessment & Plan: Visit Diagnoses:  1. Tibia/fibula fracture, right, closed, with malunion, subsequent encounter     Plan: TIME TO STOP TAKING NARCOTIC MEDICATION.  We reviewed the x-rays today which shows her fibula is healed tibia is showing progressive healing.  No loosening of the screws.  She can continue to weight-bear as tolerated with her 2 crutches work to 1 crutch and then eventually can work to 1 cane.  We discussed that she has been taking some pain medication for long period time at 1 point she was on oxycodone also OxyContin.  We discussed some mild withdrawal symptoms that she may have stopping the tramadol.  She can continue the gabapentin and should increase weightbearing as tolerated.  I will check her again in 4 months.  Follow-Up Instructions: Return in about 4 months (around 05/04/2020).   Orders:  Orders Placed This Encounter  Procedures  . XR Knee 1-2 Views Right   No orders of the defined types were placed in this encounter.     Procedures: No procedures performed   Clinical Data: No additional findings.   Subjective: Chief Complaint  Patient presents with  . Right Leg - Follow-up    10/14/2019 right tibia/fibula osteotomy, takedown malunion,plating    HPI patient returns post tibial osteotomy August tib-fib with replating.  Leg is straight states her pain is better she is occasionally taking pain medication and has been on narcotic pain medication for more than 2 years.  Currently she is on tramadol and I had a discussion with her today that it is time for her to stop the pain medication.  She states Medicare is terminated her stay at the nursing home and now she is back home.  She is amatory with her  crutches and now she is sometimes walks a little bit without the crutches.  She is using her support TED hose.  Review of Systems Reviewed updated unchanged.  Objective: Vital Signs: Ht 4\' 8"  (1.422 m)   Wt 147 lb (66.7 kg)   BMI 32.96 kg/m   Physical Exam Constitutional:      Appearance: She is well-developed.  HENT:     Head: Normocephalic.     Right Ear: External ear normal.     Left Ear: External ear normal.  Eyes:     Pupils: Pupils are equal, round, and reactive to light.  Neck:     Thyroid: No thyromegaly.     Trachea: No tracheal deviation.  Cardiovascular:     Rate and Rhythm: Normal rate.  Pulmonary:     Effort: Pulmonary effort is normal.  Abdominal:     Palpations: Abdomen is soft.  Skin:    General: Skin is warm and dry.  Neurological:     Mental Status: She is alert and oriented to person, place, and time.  Psychiatric:        Behavior: Behavior normal.     Ortho Exam tibial incisions well-healed.  Leg looks straight AP and lateral.  Station the foot is intact.  Specialty Comments:  No specialty comments available.  Imaging: XR Knee 1-2  Views Right  Result Date: 01/05/2020 Standing AP both tibia lateral right tibia obtained and reviewed.  This shows tibial plating post osteotomy for correction of deformity.  No evidence of screw loosening.  There is some posterior callus bridging at the osteotomy site.  Fibular osteotomy site healed. Impression: Interval healing right tibia-fibula osteotomy.    PMFS History: Patient Active Problem List   Diagnosis Date Noted  . Grief reaction 11/26/2019  . Sleep difficulties 11/26/2019  . Edema 11/26/2019  . Microcytic hypochromic anemia 10/27/2019  . GERD (gastroesophageal reflux disease) 10/27/2019  . Oropharyngeal dysphagia 10/27/2019  . History of colonic polyps 10/27/2019  . Tibia/fibula fracture, right, closed, with malunion, subsequent encounter 10/14/2019  . Mechanical breakdown of internal orthopedic  device (HCC) 04/01/2019   Past Medical History:  Diagnosis Date  . Anemia   . Arthritis   . Carpal tunnel syndrome    Bilateral  . COPD (chronic obstructive pulmonary disease) (HCC)   . GERD (gastroesophageal reflux disease)   . History of hiatal hernia   . Hypertension   . Migraines   . Pneumonia   . Tibia/fibula fracture    Right    No family history on file.  Past Surgical History:  Procedure Laterality Date  . ABDOMINAL HYSTERECTOMY    . BACK SURGERY     Screws and plates lower back  . CARPAL TUNNEL RELEASE Right   . COLONOSCOPY    . ECTOPIC PREGNANCY SURGERY  1986  . ORIF TIBIA & FIBULA FRACTURES Right   . TIBIA OSTEOTOMY Right 10/14/2019   Procedure: right tibia fibula osteotomy, takedown malunion, replating;  Surgeon: Eldred Manges, MD;  Location: [facility];  Service: Orthopedics;  Laterality: Right;  . UPPER GI ENDOSCOPY     Social History   Occupational History  . Not on file  Tobacco Use  . Smoking status: Current Every Day Smoker    Packs/day: 0.25    Years: 50.00    Pack years: 12.50    Types: Cigarettes  . Smokeless tobacco: Never Used  . Tobacco comment: At times she smoked as much as 2-1/2-3 packs/day.  Vaping Use  . Vaping Use: Never used  Substance and Sexual Activity  . Alcohol use: Yes  . Drug use: Never  . Sexual activity: Not on file

## 2020-01-13 ENCOUNTER — Other Ambulatory Visit: Payer: Self-pay | Admitting: Adult Health

## 2020-01-13 DIAGNOSIS — S82201P Unspecified fracture of shaft of right tibia, subsequent encounter for closed fracture with malunion: Secondary | ICD-10-CM

## 2020-01-13 DIAGNOSIS — S82401P Unspecified fracture of shaft of right fibula, subsequent encounter for closed fracture with malunion: Secondary | ICD-10-CM

## 2020-01-13 DIAGNOSIS — K219 Gastro-esophageal reflux disease without esophagitis: Secondary | ICD-10-CM

## 2020-01-22 ENCOUNTER — Telehealth: Payer: Self-pay | Admitting: Orthopaedic Surgery

## 2020-01-22 DIAGNOSIS — S82401P Unspecified fracture of shaft of right fibula, subsequent encounter for closed fracture with malunion: Secondary | ICD-10-CM

## 2020-01-22 DIAGNOSIS — S82201P Unspecified fracture of shaft of right tibia, subsequent encounter for closed fracture with malunion: Secondary | ICD-10-CM

## 2020-01-22 MED ORDER — METHOCARBAMOL 500 MG PO TABS: 500.0000 mg | ORAL_TABLET | Freq: Two times a day (BID) | ORAL | 0 refills | Status: DC | PRN

## 2020-01-22 NOTE — Telephone Encounter (Signed)
Patient called requesting a refill of tramadol and methocarbamol. Please send to pharmacy on file. Patient phone number is [phone].

## 2020-01-22 NOTE — Telephone Encounter (Signed)
Please advise 

## 2020-01-22 NOTE — Telephone Encounter (Signed)
OK for robaxin 500 mg # 20  one po bid prn spasms thanks. I called , needs to be off narcotic meds.

## 2020-01-22 NOTE — Telephone Encounter (Signed)
Sent in

## 2020-03-30 ENCOUNTER — Other Ambulatory Visit: Payer: Self-pay | Admitting: Adult Health

## 2020-03-30 DIAGNOSIS — K219 Gastro-esophageal reflux disease without esophagitis: Secondary | ICD-10-CM

## 2020-05-04 ENCOUNTER — Encounter: Payer: Self-pay | Admitting: Orthopaedic Surgery

## 2020-05-04 ENCOUNTER — Ambulatory Visit (INDEPENDENT_AMBULATORY_CARE_PROVIDER_SITE_OTHER): Payer: Medicare HMO

## 2020-05-04 ENCOUNTER — Ambulatory Visit: Payer: Medicare HMO | Admitting: Orthopaedic Surgery

## 2020-05-04 VITALS — BP 146/90 | HR 93 | Ht 59.0 in | Wt 143.0 lb

## 2020-05-04 DIAGNOSIS — S82201P Unspecified fracture of shaft of right tibia, subsequent encounter for closed fracture with malunion: Secondary | ICD-10-CM | POA: Diagnosis not present

## 2020-05-04 DIAGNOSIS — S82401P Unspecified fracture of shaft of right fibula, subsequent encounter for closed fracture with malunion: Secondary | ICD-10-CM

## 2020-05-04 NOTE — Progress Notes (Signed)
Office Visit Note   Patient: Anne Carrillo           Date of Birth: Jan 15, 1956           MRN: [id number] Visit Date: 05/04/2020              Requested by: Anne Apley, MD [address] [place],  [place] [address] PCP: Anne Apley, MD   Assessment & Plan: Visit Diagnoses:  1. Tibia/fibula fracture, right, closed, with malunion, subsequent encounter     Plan: On return visit AP lateral right tibia x-ray which hopefully will be her final x-rays for tibia.  She can progressively work on putting more weight on her leg and work on Dance movement psychotherapist.  We will x-ray her lumbar spine AP and lateral on return..  Follow-Up Instructions: Return in about 4 months (around 09/03/2020).   Orders:  Orders Placed This Encounter  Procedures  . XR Tibia/Fibula Right   No orders of the defined types were placed in this encounter.     Procedures: No procedures performed   Clinical Data: No additional findings.   Subjective: Chief Complaint  Patient presents with  . Right Leg - Follow-up    10/14/2019 Right tibia/fibula osteotomy takedown malunion,plating    HPI 65 year old female returns post revision takedown nonunion with broken plate.  X-rays today shows interval healing.  She is making bone and feels about 50% of the gap.  She can stand on her leg without her crutches but uses her crutches to prevent falling and is careful on uneven ground.  She still on narcotic pain management with Dr. Su Carrillo.  Past history of back fusion.  We have not imaged her back.  Review of Systems updated unchanged.   Objective: Vital Signs: BP (!) 146/90   Pulse 93   Ht 4\' 11"  (1.499 m)   Wt 143 lb (64.9 kg)   BMI 28.88 kg/m   Physical Exam Constitutional:      Appearance: She is well-developed.  HENT:     Head: Normocephalic.     Right Ear: External ear normal.     Left Ear: External ear normal.  Eyes:     Pupils: Pupils are equal, round, and reactive to light.  Neck:      Thyroid: No thyromegaly.     Trachea: No tracheal deviation.  Cardiovascular:     Rate and Rhythm: Normal rate.  Pulmonary:     Effort: Pulmonary effort is normal.  Abdominal:     Palpations: Abdomen is soft.  Skin:    General: Skin is warm and dry.  Neurological:     Mental Status: She is alert and oriented to person, place, and time.  Psychiatric:        Mood and Affect: Mood and affect normal.        Behavior: Behavior normal.     Ortho Exam incision the tibia is well-healed.  Muscle compartment anterior compartment and peroneal stick out slightly more prominent since her deformity has been corrected and fascia was released.  She still has mild tenderness mid calf.  Good ankle range of motion.  Mild crepitus with knee range of motion.  Specialty Comments:  No specialty comments available.  Imaging: No results found.   PMFS History: Patient Active Problem List   Diagnosis Date Noted  . Grief reaction 11/26/2019  . Sleep difficulties 11/26/2019  . Edema 11/26/2019  . Microcytic hypochromic anemia 10/27/2019  . GERD (gastroesophageal reflux disease) 10/27/2019  . Oropharyngeal dysphagia  10/27/2019  . History of colonic polyps 10/27/2019  . Tibia/fibula fracture, right, closed, with malunion, subsequent encounter 10/14/2019  . Mechanical breakdown of internal orthopedic device (HCC) 04/01/2019   Past Medical History:  Diagnosis Date  . Anemia   . Arthritis   . Carpal tunnel syndrome    Bilateral  . COPD (chronic obstructive pulmonary disease) (HCC)   . GERD (gastroesophageal reflux disease)   . History of hiatal hernia   . Hypertension   . Migraines   . Pneumonia   . Tibia/fibula fracture    Right    No family history on file.  Past Surgical History:  Procedure Laterality Date  . ABDOMINAL HYSTERECTOMY    . BACK SURGERY     Screws and plates lower back  . CARPAL TUNNEL RELEASE Right   . COLONOSCOPY    . ECTOPIC PREGNANCY SURGERY  1986  . ORIF TIBIA &  FIBULA FRACTURES Right   . TIBIA OSTEOTOMY Right 10/14/2019   Procedure: right tibia fibula osteotomy, takedown malunion, replating;  Surgeon: Anne Manges, MD;  Location: [facility];  Service: Orthopedics;  Laterality: Right;  . UPPER GI ENDOSCOPY     Social History   Occupational History  . Not on file  Tobacco Use  . Smoking status: Current Every Day Smoker    Packs/day: 0.25    Years: 50.00    Pack years: 12.50    Types: Cigarettes  . Smokeless tobacco: Never Used  . Tobacco comment: At times she smoked as much as 2-1/2-3 packs/day.  Vaping Use  . Vaping Use: Never used  Substance and Sexual Activity  . Alcohol use: Yes  . Drug use: Never  . Sexual activity: Not on file

## 2020-08-05 ENCOUNTER — Emergency Department (HOSPITAL_COMMUNITY): Payer: Medicare HMO

## 2020-08-05 ENCOUNTER — Encounter (HOSPITAL_COMMUNITY): Payer: Self-pay

## 2020-08-05 ENCOUNTER — Other Ambulatory Visit: Payer: Self-pay

## 2020-08-05 ENCOUNTER — Inpatient Hospital Stay (HOSPITAL_COMMUNITY)
Admission: EM | Admit: 2020-08-05 | Discharge: 2020-08-14 | DRG: 369 | Disposition: A | Discharge status: Home Health | Payer: Medicare HMO | Attending: Internal Medicine | Admitting: Internal Medicine

## 2020-08-05 DIAGNOSIS — E876 Hypokalemia: Secondary | ICD-10-CM | POA: Diagnosis present

## 2020-08-05 DIAGNOSIS — Z79899 Other long term (current) drug therapy: Secondary | ICD-10-CM

## 2020-08-05 DIAGNOSIS — K2101 Gastro-esophageal reflux disease with esophagitis, with bleeding: Secondary | ICD-10-CM | POA: Diagnosis not present

## 2020-08-05 DIAGNOSIS — K922 Gastrointestinal hemorrhage, unspecified: Secondary | ICD-10-CM | POA: Diagnosis not present

## 2020-08-05 DIAGNOSIS — J449 Chronic obstructive pulmonary disease, unspecified: Secondary | ICD-10-CM

## 2020-08-05 DIAGNOSIS — N390 Urinary tract infection, site not specified: Secondary | ICD-10-CM | POA: Diagnosis present

## 2020-08-05 DIAGNOSIS — K921 Melena: Secondary | ICD-10-CM | POA: Diagnosis present

## 2020-08-05 DIAGNOSIS — Z888 Allergy status to other drugs, medicaments and biological substances status: Secondary | ICD-10-CM

## 2020-08-05 DIAGNOSIS — K920 Hematemesis: Secondary | ICD-10-CM | POA: Diagnosis present

## 2020-08-05 DIAGNOSIS — Z79891 Long term (current) use of opiate analgesic: Secondary | ICD-10-CM

## 2020-08-05 DIAGNOSIS — M899 Disorder of bone, unspecified: Secondary | ICD-10-CM | POA: Diagnosis present

## 2020-08-05 DIAGNOSIS — F17210 Nicotine dependence, cigarettes, uncomplicated: Secondary | ICD-10-CM | POA: Diagnosis present

## 2020-08-05 DIAGNOSIS — K449 Diaphragmatic hernia without obstruction or gangrene: Secondary | ICD-10-CM | POA: Diagnosis present

## 2020-08-05 DIAGNOSIS — Z20822 Contact with and (suspected) exposure to covid-19: Secondary | ICD-10-CM | POA: Diagnosis present

## 2020-08-05 DIAGNOSIS — G8928 Other chronic postprocedural pain: Secondary | ICD-10-CM | POA: Diagnosis not present

## 2020-08-05 DIAGNOSIS — I10 Essential (primary) hypertension: Secondary | ICD-10-CM | POA: Diagnosis present

## 2020-08-05 DIAGNOSIS — R911 Solitary pulmonary nodule: Secondary | ICD-10-CM | POA: Diagnosis not present

## 2020-08-05 DIAGNOSIS — D509 Iron deficiency anemia, unspecified: Secondary | ICD-10-CM | POA: Diagnosis present

## 2020-08-05 DIAGNOSIS — R627 Adult failure to thrive: Secondary | ICD-10-CM | POA: Diagnosis present

## 2020-08-05 DIAGNOSIS — D62 Acute posthemorrhagic anemia: Secondary | ICD-10-CM | POA: Diagnosis present

## 2020-08-05 DIAGNOSIS — K222 Esophageal obstruction: Secondary | ICD-10-CM | POA: Diagnosis present

## 2020-08-05 DIAGNOSIS — K5900 Constipation, unspecified: Secondary | ICD-10-CM | POA: Diagnosis present

## 2020-08-05 DIAGNOSIS — K219 Gastro-esophageal reflux disease without esophagitis: Secondary | ICD-10-CM

## 2020-08-05 DIAGNOSIS — G894 Chronic pain syndrome: Secondary | ICD-10-CM | POA: Diagnosis present

## 2020-08-05 DIAGNOSIS — R109 Unspecified abdominal pain: Secondary | ICD-10-CM

## 2020-08-05 DIAGNOSIS — B9620 Unspecified Escherichia coli [E. coli] as the cause of diseases classified elsewhere: Secondary | ICD-10-CM | POA: Diagnosis present

## 2020-08-05 DIAGNOSIS — R1312 Dysphagia, oropharyngeal phase: Secondary | ICD-10-CM

## 2020-08-05 DIAGNOSIS — R059 Cough, unspecified: Secondary | ICD-10-CM

## 2020-08-05 DIAGNOSIS — M1990 Unspecified osteoarthritis, unspecified site: Secondary | ICD-10-CM | POA: Diagnosis present

## 2020-08-05 DIAGNOSIS — Z8249 Family history of ischemic heart disease and other diseases of the circulatory system: Secondary | ICD-10-CM

## 2020-08-05 DIAGNOSIS — R1010 Upper abdominal pain, unspecified: Secondary | ICD-10-CM

## 2020-08-05 DIAGNOSIS — R0602 Shortness of breath: Secondary | ICD-10-CM

## 2020-08-05 DIAGNOSIS — Z885 Allergy status to narcotic agent status: Secondary | ICD-10-CM

## 2020-08-05 DIAGNOSIS — G8929 Other chronic pain: Secondary | ICD-10-CM

## 2020-08-05 DIAGNOSIS — J441 Chronic obstructive pulmonary disease with (acute) exacerbation: Secondary | ICD-10-CM | POA: Diagnosis present

## 2020-08-05 DIAGNOSIS — Z90710 Acquired absence of both cervix and uterus: Secondary | ICD-10-CM

## 2020-08-05 LAB — COMPREHENSIVE METABOLIC PANEL
ALT: 41 U/L (ref 0–44)
AST: 70 U/L — ABNORMAL HIGH (ref 15–41)
Albumin: 3.8 g/dL (ref 3.5–5.0)
Alkaline Phosphatase: 128 U/L — ABNORMAL HIGH (ref 38–126)
Anion gap: 12 (ref 5–15)
BUN: 13 mg/dL (ref 8–23)
CO2: 25 mmol/L (ref 22–32)
Calcium: 9.1 mg/dL (ref 8.9–10.3)
Chloride: 97 mmol/L — ABNORMAL LOW (ref 98–111)
Creatinine, Ser: 0.71 mg/dL (ref 0.44–1.00)
GFR, Estimated: >60 mL/min (ref >60)
Glucose, Bld: 105 mg/dL — ABNORMAL HIGH (ref 70–99)
Potassium: 3.7 mmol/L (ref 3.5–5.1)
Sodium: 134 mmol/L — ABNORMAL LOW (ref 135–145)
Total Bilirubin: 0.5 mg/dL (ref 0.3–1.2)
Total Protein: 8.6 g/dL — ABNORMAL HIGH (ref 6.5–8.1)

## 2020-08-05 LAB — CBC
HCT: 40.2 % (ref 36.0–46.0)
Hemoglobin: 13.2 g/dL (ref 12.0–15.0)
MCH: 29.3 pg (ref 26.0–34.0)
MCHC: 32.8 g/dL (ref 30.0–36.0)
MCV: 89.1 fL (ref 80.0–100.0)
Platelets: 409 10*3/uL — ABNORMAL HIGH (ref 150–400)
RBC: 4.51 MIL/uL (ref 3.87–5.11)
RDW: 15.4 % (ref 11.5–15.5)
WBC: 15.3 10*3/uL — ABNORMAL HIGH (ref 4.0–10.5)
nRBC: 0 % (ref 0.0–0.2)

## 2020-08-05 LAB — POC OCCULT BLOOD, ED: Fecal Occult Bld: POSITIVE — AB

## 2020-08-05 LAB — LIPASE, BLOOD: Lipase: 37 U/L (ref 11–51)

## 2020-08-05 MED ORDER — ACETAMINOPHEN 325 MG PO TABS
650.0000 mg | ORAL_TABLET | Freq: Four times a day (QID) | ORAL | Status: DC | PRN
  Administered 2020-08-06 – 2020-08-14 (×5): 650 mg via ORAL
  Filled 2020-08-05 (×5): qty 2

## 2020-08-05 MED ORDER — PANTOPRAZOLE SODIUM 40 MG IV SOLR: 40.0000 mg | INTRAVENOUS | Status: DC

## 2020-08-05 MED ORDER — SODIUM CHLORIDE 0.9 % IV BOLUS
1000.0000 mL | Freq: Once | INTRAVENOUS | Status: AC
  Administered 2020-08-05: 1000 mL via INTRAVENOUS

## 2020-08-05 MED ORDER — PANTOPRAZOLE SODIUM 40 MG IV SOLR
40.0000 mg | Freq: Once | INTRAVENOUS | Status: AC
  Administered 2020-08-05: 40 mg via INTRAVENOUS
  Filled 2020-08-05: qty 40

## 2020-08-05 MED ORDER — ONDANSETRON HCL 4 MG/2ML IJ SOLN
4.0000 mg | Freq: Once | INTRAMUSCULAR | Status: AC
  Administered 2020-08-05: 4 mg via INTRAVENOUS
  Filled 2020-08-05: qty 2

## 2020-08-05 MED ORDER — ONDANSETRON HCL 4 MG/2ML IJ SOLN: 4.0000 mg | Freq: Four times a day (QID) | INTRAMUSCULAR | Status: DC | PRN

## 2020-08-05 MED ORDER — SODIUM CHLORIDE 0.9 % IV SOLN: INTRAVENOUS | Status: AC

## 2020-08-05 NOTE — ED Provider Notes (Signed)
[facility]-EMERGENCY DEPT Provider Note   CSN: [id number] Arrival date & time: 08/05/20  1712     History Chief Complaint  Patient presents with  . Abdominal Pain  . Emesis    Anne Carrillo is a 65 y.o. female.  Patient complains of abdominal pain and vomiting since Monday.  She also states that she has been having black stools for the last few days but she is on iron and took some Pepto-Bismol.  Patient is followed at GI for esophageal stricture.  The history is provided by the patient and medical records. No language interpreter was used.  Abdominal Pain Pain location:  Generalized Pain quality: aching   Pain radiates to:  Does not radiate Pain severity:  Moderate Onset quality:  Sudden Timing:  Constant Progression:  Worsening Chronicity:  Recurrent Associated symptoms: vomiting   Associated symptoms: no chest pain, no cough, no diarrhea, no fatigue and no hematuria   Emesis Associated symptoms: abdominal pain   Associated symptoms: no cough, no diarrhea and no headaches        Past Medical History:  Diagnosis Date  . Anemia   . Arthritis   . Carpal tunnel syndrome    Bilateral  . COPD (chronic obstructive pulmonary disease) (HCC)   . GERD (gastroesophageal reflux disease)   . History of hiatal hernia   . Hypertension   . Migraines   . Pneumonia   . Tibia/fibula fracture    Right    Patient Active Problem List   Diagnosis Date Noted  . Grief reaction 11/26/2019  . Sleep difficulties 11/26/2019  . Edema 11/26/2019  . Microcytic hypochromic anemia 10/27/2019  . GERD (gastroesophageal reflux disease) 10/27/2019  . Oropharyngeal dysphagia 10/27/2019  . History of colonic polyps 10/27/2019  . Tibia/fibula fracture, right, closed, with malunion, subsequent encounter 10/14/2019  . Mechanical breakdown of internal orthopedic device (HCC) 04/01/2019    Past Surgical History:  Procedure Laterality Date  . ABDOMINAL HYSTERECTOMY    .  BACK SURGERY     Screws and plates lower back  . CARPAL TUNNEL RELEASE Right   . COLONOSCOPY    . ECTOPIC PREGNANCY SURGERY  1986  . ORIF TIBIA & FIBULA FRACTURES Right   . TIBIA OSTEOTOMY Right 10/14/2019   Procedure: right tibia fibula osteotomy, takedown malunion, replating;  Surgeon: Eldred Manges, MD;  Location: [facility];  Service: Orthopedics;  Laterality: Right;  . UPPER GI ENDOSCOPY       OB History   No obstetric history on file.     Family History  Problem Relation Age of Onset  . Heart attack Mother     Social History   Tobacco Use  . Smoking status: Current Every Day Smoker    Packs/day: 0.25    Years: 50.00    Pack years: 12.50    Types: Cigarettes  . Smokeless tobacco: Never Used  . Tobacco comment: At times she smoked as much as 2-1/2-3 packs/day.  Vaping Use  . Vaping Use: Never used  Substance Use Topics  . Alcohol use: Yes  . Drug use: Never    Home Medications Prior to Admission medications   Medication Sig Start Date End Date Taking? Authorizing Provider  cyclobenzaprine (FLEXERIL) 5 MG tablet TAKE 1 TABLET BY MOUTH THREE TIMES DAILY AS NEEDED FOR MUSCLE SPASMS FOR UP TO 10 DAYS    [provider]  ferrous sulfate 325 (65 FE) MG tablet Take 1 tablet (325 mg total) by mouth 2 (two)  times daily with a meal. 11/05/19   Medina-Vargas, Monina C, NP  gabapentin (NEURONTIN) 600 MG tablet Take 1 tablet (600 mg total) by mouth 3 (three) times daily. 11/05/19   Medina-Vargas, Monina C, NP  melatonin 5 MG TABS Take 5 mg by mouth daily. 1 hour after evening meal    [provider]  methocarbamol (ROBAXIN) 500 MG tablet Take 1 tablet (500 mg total) by mouth 2 (two) times daily as needed for muscle spasms. Patient not taking: Reported on 05/04/2020 01/22/20   Eldred Manges, MD  Nutritional Supplements (ENSURE ACTIVE HIGH PROTEIN) LIQD Take 237 mLs by mouth daily.    [provider]  ondansetron (ZOFRAN) 4 MG tablet Take 4 mg by mouth every 6  (six) hours as needed for nausea or vomiting. x1 week 10/28/19   [provider]  oxyCODONE-acetaminophen (PERCOCET) 5-325 MG tablet Take 1 tablet by mouth every 6 (six) hours as needed for severe pain. 11/24/19   Medina-Vargas, Monina C, NP  pantoprazole (PROTONIX) 40 MG tablet Take 1 tablet (40 mg total) by mouth daily. 11/05/19   Medina-Vargas, Monina C, NP  sennosides-docusate sodium (SENOKOT-S) 8.6-50 MG tablet Take 2 tablets by mouth in the morning and at bedtime.    [provider]  traMADol Janean Sark) 50 MG tablet  12/11/19   [provider]    Allergies    Hydrocodone, Hydromorphone hcl, Other, and Potassium  Review of Systems   Review of Systems  Constitutional: Negative for appetite change and fatigue.  HENT: Negative for congestion, ear discharge and sinus pressure.   Eyes: Negative for discharge.  Respiratory: Negative for cough.   Cardiovascular: Negative for chest pain.  Gastrointestinal: Positive for abdominal pain and vomiting. Negative for diarrhea.  Genitourinary: Negative for frequency and hematuria.  Musculoskeletal: Negative for back pain.  Skin: Negative for rash.  Neurological: Negative for seizures and headaches.  Psychiatric/Behavioral: Negative for hallucinations.    Physical Exam Updated Vital Signs BP (!) 149/90   Pulse (!) 110   Temp 98.1 F (36.7 C) (Oral)   Resp 17   Ht 5' (1.524 m)   Wt 60.8 kg   SpO2 93%   BMI 26.17 kg/m   Physical Exam Vitals and nursing note reviewed.  Constitutional:      Appearance: She is well-developed.  HENT:     Head: Normocephalic.     Nose: Nose normal.  Eyes:     General: No scleral icterus.    Conjunctiva/sclera: Conjunctivae normal.  Neck:     Thyroid: No thyromegaly.  Cardiovascular:     Rate and Rhythm: Normal rate and regular rhythm.     Heart sounds: No murmur heard. No friction rub. No gallop.   Pulmonary:     Breath sounds: No stridor. No wheezing or rales.  Chest:      Chest wall: No tenderness.  Abdominal:     General: There is no distension.     Tenderness: There is abdominal tenderness. There is no rebound.  Genitourinary:    Comments: Rectal Black stool heme positive Musculoskeletal:        General: Normal range of motion.     Cervical back: Neck supple.  Lymphadenopathy:     Cervical: No cervical adenopathy.  Skin:    Findings: No erythema or rash.  Neurological:     Mental Status: She is alert and oriented to person, place, and time.     Motor: No abnormal muscle tone.     Coordination:  Coordination normal.  Psychiatric:        Behavior: Behavior normal.     ED Results / Procedures / Treatments   Labs (all labs ordered are listed, but only abnormal results are displayed) Labs Reviewed  COMPREHENSIVE METABOLIC PANEL - Abnormal; Notable for the following components:      Result Value   Sodium 134 (*)    Chloride 97 (*)    Glucose, Bld 105 (*)    Total Protein 8.6 (*)    AST 70 (*)    Alkaline Phosphatase 128 (*)    All other components within normal limits  CBC - Abnormal; Notable for the following components:   WBC 15.3 (*)    Platelets 409 (*)    All other components within normal limits  POC OCCULT BLOOD, ED - Abnormal; Notable for the following components:   Fecal Occult Bld POSITIVE (*)    All other components within normal limits  LIPASE, BLOOD  URINALYSIS, ROUTINE W REFLEX MICROSCOPIC  OCCULT BLOOD X 1 CARD TO LAB, STOOL    EKG None  Radiology CT ABDOMEN PELVIS WO CONTRAST  Result Date: 08/05/2020 CLINICAL DATA:  Nausea, vomiting, abdominal pain EXAM: CT ABDOMEN AND PELVIS WITHOUT CONTRAST TECHNIQUE: Multidetector CT imaging of the abdomen and pelvis was performed following the standard protocol without IV contrast. COMPARISON:  None. FINDINGS: Lower chest: Moderate-sized hiatal hernia. Calcified granulomas in the right lower lobe. Calcified right hilar lymph nodes. Peripheral noncalcified right lower lobe nodule  measures 2 cm on image 32. Hepatobiliary: No focal hepatic abnormality. Gallbladder unremarkable. Pancreas: No focal abnormality or ductal dilatation. Spleen: Calcifications throughout the spleen.  Normal size. Adrenals/Urinary Tract: No adrenal abnormality. No focal renal abnormality. No stones or hydronephrosis. Urinary bladder is unremarkable. Stomach/Bowel: Normal appendix. Stomach, large and small bowel grossly unremarkable. Vascular/Lymphatic: Aortoiliac atherosclerosis. No evidence of aneurysm or adenopathy. Reproductive: Prior hysterectomy.  No adnexal masses. Other: No free fluid or free air. Musculoskeletal: No acute bony abnormality. Postoperative and degenerative changes in the lumbar spine. IMPRESSION: No acute findings in the abdomen or pelvis. Moderate-sized hiatal hernia. Old granulomatous disease. Smooth oval peripheral 2 cm nodule at the right lung base. This has a benign appearance but is nonspecific. This could be followed with repeat CT in 6 months to assess stability. Electronically Signed   By: Charlett Nose M.D.   On: 08/05/2020 20:49    Procedures Procedures   Medications Ordered in ED Medications  sodium chloride 0.9 % bolus 1,000 mL (1,000 mLs Intravenous New Bag/Given 08/05/20 2059)  pantoprazole (PROTONIX) injection 40 mg (40 mg Intravenous Given 08/05/20 2100)  ondansetron (ZOFRAN) injection 4 mg (4 mg Intravenous Given 08/05/20 2100)    ED Course  I have reviewed the triage vital signs and the nursing notes.  Pertinent labs & imaging results that were available during my care of the patient were reviewed by me and considered in my medical decision making (see chart for details).    MDM Rules/Calculators/A&P                         pt with her upper GI bleed.  CT shows large hiatal hernia.  Patient with abdominal pain vomiting she will be admitted to medicine with GI consult tomorrow  Final Clinical Impression(s) / ED Diagnoses Final diagnoses:  Pain of upper abdomen     Rx / DC Orders ED Discharge Orders    None       Bethann Berkshire,  MD 08/05/20 2213

## 2020-08-05 NOTE — ED Triage Notes (Signed)
Pt came from home via EMS. Pt reports n/v x 3 days. Pt also reports black stools x 3 days, pt is taking iron supplements and pt's MD told her black stools can occur. Pt reports sore throat from vomiting and generally feeling unwell. Afebrile today but feels warm to touch.  Pt ambulatory with 2 person assist PMH of COPD, sob with exertion CBG: 111 180/100 110 bpm 16 RR 99% on RA

## 2020-08-05 NOTE — H&P (Signed)
History and Physical    Anne Carrillo NAT:[id number] DOB: [birth year] DOA: 08/05/2020  PCP: Burton Apley, MD  Patient coming from: Home  I have personally briefly reviewed patient's old medical records in [facility]  Chief Complaint: Persistent nausea and vomiting  HPI: Anne Carrillo is a 65 y.o. female with medical history significant for esophageal stricture, COPD,iron deficiency anemia, GERD and chronic pain who presents with concerns of persistent nausea and vomiting and hematemesis.  For the past week patient has been having nausea and vomiting.  Initially the vomitus was clear but for the past few days he has trended to dark red.  She is on iron supplementation and also has tried to take Pepto-Bismol without relief.  Also has diffuse upper abdominal pain.  No diarrhea.  No fever.  Denies NSAID use.  She has history of esophageal stricture with dilation about 7 to 8 years ago in [place].  Recently has been having dysphagia and trouble swallowing solids.  Her last endoscopy and colonoscopy were both in [place] about 7 to 8 years ago.  ED Course: She was tachycardic and mildly hypertensive.  Leukocytosis of 15 K, hemoglobin 13.2, Hemoccult positive.  CMP otherwise unremarkable.  CT of the abdomen shows moderate size hiatal hernia but no other acute findings. Review of Systems:  Constitutional: No Weight Change, No Fever ENT/Mouth: No sore throat, No Rhinorrhea Eyes: No Eye Pain, No Vision Changes Cardiovascular: No Chest Pain, no SOB,No Edema,  Respiratory: No Cough, No Sputum, No Wheezing, no Dyspnea  Gastrointestinal: +Nausea, +Vomiting, No Diarrhea, No Constipation,+ Pain Genitourinary: no Urinary Incontinence, No Urgency, No Flank Pain Musculoskeletal: No Arthralgias, No Myalgias Skin: No Skin Lesions, No Pruritus, Neuro: no Weakness, No Numbness,  No Loss of Consciousness, No Syncope Psych: No Anxiety/Panic, No Depression, no decrease appetite Heme/Lymph: No Bruising,  No Bleeding  Past Medical History:  Diagnosis Date  . Anemia   . Arthritis   . Carpal tunnel syndrome    Bilateral  . COPD (chronic obstructive pulmonary disease) (HCC)   . GERD (gastroesophageal reflux disease)   . History of hiatal hernia   . Hypertension   . Migraines   . Pneumonia   . Tibia/fibula fracture    Right    Past Surgical History:  Procedure Laterality Date  . ABDOMINAL HYSTERECTOMY    . BACK SURGERY     Screws and plates lower back  . CARPAL TUNNEL RELEASE Right   . COLONOSCOPY    . ECTOPIC PREGNANCY SURGERY  1986  . ORIF TIBIA & FIBULA FRACTURES Right   . TIBIA OSTEOTOMY Right 10/14/2019   Procedure: right tibia fibula osteotomy, takedown malunion, replating;  Surgeon: Eldred Manges, MD;  Location: [facility];  Service: Orthopedics;  Laterality: Right;  . UPPER GI ENDOSCOPY       reports that she has been smoking cigarettes. She has a 12.50 pack-year smoking history. She has never used smokeless tobacco. She reports current alcohol use. She reports that she does not use drugs. Social History  Allergies  Allergen Reactions  . Hydrocodone Other (See Comments)  . Hydromorphone Hcl     Other reaction(s): itching  . Other Other (See Comments)  . Potassium     Other reaction(s): hives    Family History  Problem Relation Age of Onset  . Heart attack Mother      Prior to Admission medications   Medication Sig Start Date End Date Taking? Authorizing Provider  cyclobenzaprine (FLEXERIL) 5 MG tablet TAKE 1 TABLET  BY MOUTH THREE TIMES DAILY AS NEEDED FOR MUSCLE SPASMS FOR UP TO 10 DAYS    [provider]  ferrous sulfate 325 (65 FE) MG tablet Take 1 tablet (325 mg total) by mouth 2 (two) times daily with a meal. 11/05/19   Medina-Vargas, Monina C, NP  gabapentin (NEURONTIN) 600 MG tablet Take 1 tablet (600 mg total) by mouth 3 (three) times daily. 11/05/19   Medina-Vargas, Monina C, NP  melatonin 5 MG TABS Take 5 mg by mouth daily. 1 hour after evening meal     [provider]  methocarbamol (ROBAXIN) 500 MG tablet Take 1 tablet (500 mg total) by mouth 2 (two) times daily as needed for muscle spasms. Patient not taking: Reported on 05/04/2020 01/22/20   Eldred MangesYates, Mark C, MD  Nutritional Supplements (ENSURE ACTIVE HIGH PROTEIN) LIQD Take 237 mLs by mouth daily.    [provider]  ondansetron (ZOFRAN) 4 MG tablet Take 4 mg by mouth every 6 (six) hours as needed for nausea or vomiting. x1 week 10/28/19   [provider]  oxyCODONE-acetaminophen (PERCOCET) 5-325 MG tablet Take 1 tablet by mouth every 6 (six) hours as needed for severe pain. 11/24/19   Medina-Vargas, Monina C, NP  pantoprazole (PROTONIX) 40 MG tablet Take 1 tablet (40 mg total) by mouth daily. 11/05/19   Medina-Vargas, Monina C, NP  sennosides-docusate sodium (SENOKOT-S) 8.6-50 MG tablet Take 2 tablets by mouth in the morning and at bedtime.    [provider]  traMADol Janean Sark(ULTRAM) 50 MG tablet  12/11/19   [provider]    Physical Exam: Vitals:   08/05/20 2058 08/05/20 2100 08/05/20 2200 08/05/20 2212  BP: (!) 152/102 (!) 167/101 (!) 149/90 140/90  Pulse: (!) 113 (!) 118 (!) 110 (!) 108  Resp: 18 17  17   Temp:      TempSrc:      SpO2: 96% 96% 93% 95%  Weight:      Height:        Constitutional: NAD, calm, comfortable thin elderly female laying flat in bed Vitals:   08/05/20 2058 08/05/20 2100 08/05/20 2200 08/05/20 2212  BP: (!) 152/102 (!) 167/101 (!) 149/90 140/90  Pulse: (!) 113 (!) 118 (!) 110 (!) 108  Resp: 18 17  17   Temp:      TempSrc:      SpO2: 96% 96% 93% 95%  Weight:      Height:       Eyes: PERRL, lids and conjunctivae normal ENMT: Mucous membranes are moist.  Neck: normal, supple Respiratory: Diffuse expiratory wheezing.  Normal respiratory effort. No accessory muscle use.  Cardiovascular: Regular rate and rhythm, no murmurs / rubs / gallops. No extremity edema.  Abdomen: Upper abdominal tenderness and left lower  quadrant tenderness, no masses palpated. Bowel sounds positive.  Musculoskeletal: no clubbing / cyanosis. No joint deformity upper and lower extremities. Good ROM, no contractures. Normal muscle tone.  Skin: no rashes, lesions, ulcers. No induration Neurologic: CN 2-12 grossly intact. Sensation intact. Strength 5/5 in all 4.  Psychiatric: Normal judgment and insight. Alert and oriented x 3. Normal mood.    Labs on Admission: I have personally reviewed following labs and imaging studies  CBC: Recent Labs  Lab 08/05/20 1749  WBC 15.3*  HGB 13.2  HCT 40.2  MCV 89.1  PLT 409*   Basic Metabolic Panel: Recent Labs  Lab 08/05/20 1749  NA 134*  K 3.7  CL 97*  CO2 25  GLUCOSE 105*  BUN 13  CREATININE 0.71  CALCIUM 9.1   GFR: Estimated Creatinine Clearance: 57.1 mL/min (by C-G formula based on SCr of 0.71 mg/dL). Liver Function Tests: Recent Labs  Lab 08/05/20 1749  AST 70*  ALT 41  ALKPHOS 128*  BILITOT 0.5  PROT 8.6*  ALBUMIN 3.8   Recent Labs  Lab 08/05/20 1749  LIPASE 37   No results for input(s): AMMONIA in the last 168 hours. Coagulation Profile: No results for input(s): INR, PROTIME in the last 168 hours. Cardiac Enzymes: No results for input(s): CKTOTAL, CKMB, CKMBINDEX, TROPONINI in the last 168 hours. BNP (last 3 results) No results for input(s): PROBNP in the last 8760 hours. HbA1C: No results for input(s): HGBA1C in the last 72 hours. CBG: No results for input(s): GLUCAP in the last 168 hours. Lipid Profile: No results for input(s): CHOL, HDL, LDLCALC, TRIG, CHOLHDL, LDLDIRECT in the last 72 hours. Thyroid Function Tests: No results for input(s): TSH, T4TOTAL, FREET4, T3FREE, THYROIDAB in the last 72 hours. Anemia Panel: No results for input(s): VITAMINB12, FOLATE, FERRITIN, TIBC, IRON, RETICCTPCT in the last 72 hours. Urine analysis: No results found for: COLORURINE, APPEARANCEUR, LABSPEC, PHURINE, GLUCOSEU, HGBUR, BILIRUBINUR, KETONESUR,  PROTEINUR, UROBILINOGEN, NITRITE, LEUKOCYTESUR  Radiological Exams on Admission: CT ABDOMEN PELVIS WO CONTRAST  Result Date: 08/05/2020 CLINICAL DATA:  Nausea, vomiting, abdominal pain EXAM: CT ABDOMEN AND PELVIS WITHOUT CONTRAST TECHNIQUE: Multidetector CT imaging of the abdomen and pelvis was performed following the standard protocol without IV contrast. COMPARISON:  None. FINDINGS: Lower chest: Moderate-sized hiatal hernia. Calcified granulomas in the right lower lobe. Calcified right hilar lymph nodes. Peripheral noncalcified right lower lobe nodule measures 2 cm on image 32. Hepatobiliary: No focal hepatic abnormality. Gallbladder unremarkable. Pancreas: No focal abnormality or ductal dilatation. Spleen: Calcifications throughout the spleen.  Normal size. Adrenals/Urinary Tract: No adrenal abnormality. No focal renal abnormality. No stones or hydronephrosis. Urinary bladder is unremarkable. Stomach/Bowel: Normal appendix. Stomach, large and small bowel grossly unremarkable. Vascular/Lymphatic: Aortoiliac atherosclerosis. No evidence of aneurysm or adenopathy. Reproductive: Prior hysterectomy.  No adnexal masses. Other: No free fluid or free air. Musculoskeletal: No acute bony abnormality. Postoperative and degenerative changes in the lumbar spine. IMPRESSION: No acute findings in the abdomen or pelvis. Moderate-sized hiatal hernia. Old granulomatous disease. Smooth oval peripheral 2 cm nodule at the right lung base. This has a benign appearance but is nonspecific. This could be followed with repeat CT in 6 months to assess stability. Electronically Signed   By: Charlett Nose M.D.   On: 08/05/2020 20:49      Assessment/Plan  Acute GI bleed/hematemesis -Hemoglobin stable at 13.2 -FOBT positive -Last endoscopy/colonscopy in [place] so unfortunately do not have records -needs GI consult -keep NPO -daily IV PPI  Dysphagia with history of esophageal stricture -Patient endorse previous dilatation in the  past -Need to consult GI for endoscopy  Right lung base nodule -Incidental finding on CT.  Need to follow-up with repeat CT in 6 months.  COPD - not in acute exacerbation.  Continue prn bronchodilator  Chronic leg pain -continue home pain regimen   DVT prophylaxis:.SCDs Code Status: Full Family Communication: Plan discussed with patient at bedside  disposition Plan: Home with observation Consults called:  Admission status: Observation  Level of care: Progressive  Status is: Observation  The patient remains OBS appropriate and will d/c before 2 midnights.  Dispo: The patient is from: Home              Anticipated d/c is to: Home  Patient currently is not medically stable to d/c.   Difficult to place patient No         Anselm Jungling DO [facility]   If 7PM-7AM, please contact night-coverage [email or web address]   08/05/2020, 10:42 PM

## 2020-08-05 NOTE — ED Provider Notes (Signed)
Emergency Medicine Provider Triage Evaluation Note  Pandora Mccrackin , a 65 y.o. female  was evaluated in triage.  Pt complains of abd pain.  Review of Systems  Positive: abd pain, n/v Negative: Fever, cp, dysuria  Physical Exam  BP (!) 132/92 (BP Location: Left Arm)   Pulse (!) 108   Temp 98.1 F (36.7 C) (Oral)   Resp 16   Ht 5' (1.524 m)   Wt 60.8 kg   SpO2 98%   BMI 26.17 kg/m  Gen:   Awake, no distress   Resp:  Normal effort  MSK:   Moves extremities without difficulty  Other:    Medical Decision Making  Medically screening exam initiated at 5:39 PM.  Appropriate orders placed.  Alaya Iverson was informed that the remainder of the evaluation will be completed by another provider, this initial triage assessment does not replace that evaluation, and the importance of remaining in the ED until their evaluation is complete.  Several days of abdominal discomfort along with persistent nausea and vomiting.  Normal BM, no fever or chills.  Did noted stool is dark but currently taking pepto bismol and iron supplementation.     Fayrene Helper, PA-C 08/05/20 1749    Bethann Berkshire, MD 08/05/20 2252

## 2020-08-06 ENCOUNTER — Observation Stay (HOSPITAL_COMMUNITY): Payer: Medicare HMO | Admitting: Anesthesiology

## 2020-08-06 ENCOUNTER — Encounter (HOSPITAL_COMMUNITY)
Admission: EM | Disposition: A | Discharge status: Home Health | Payer: Self-pay | Source: Home / Self Care | Attending: Internal Medicine

## 2020-08-06 ENCOUNTER — Encounter (HOSPITAL_COMMUNITY): Payer: Self-pay | Admitting: Family Medicine

## 2020-08-06 DIAGNOSIS — K922 Gastrointestinal hemorrhage, unspecified: Secondary | ICD-10-CM | POA: Diagnosis not present

## 2020-08-06 HISTORY — PX: ESOPHAGOGASTRODUODENOSCOPY (EGD) WITH PROPOFOL: SHX5813

## 2020-08-06 LAB — URINALYSIS, ROUTINE W REFLEX MICROSCOPIC
Bilirubin Urine: NEGATIVE
Glucose, UA: NEGATIVE mg/dL
Ketones, ur: 5 mg/dL — AB
Nitrite: NEGATIVE
Protein, ur: 100 mg/dL — AB
Specific Gravity, Urine: 1.016 (ref 1.005–1.030)
WBC, UA: >50 WBC/hpf — ABNORMAL HIGH (ref 0–5)
pH: 5 (ref 5.0–8.0)

## 2020-08-06 LAB — BASIC METABOLIC PANEL
Anion gap: 10 (ref 5–15)
BUN: 11 mg/dL (ref 8–23)
CO2: 23 mmol/L (ref 22–32)
Calcium: 7.9 mg/dL — ABNORMAL LOW (ref 8.9–10.3)
Chloride: 103 mmol/L (ref 98–111)
Creatinine, Ser: 0.84 mg/dL (ref 0.44–1.00)
GFR, Estimated: >60 mL/min (ref >60)
Glucose, Bld: 92 mg/dL (ref 70–99)
Potassium: 3 mmol/L — ABNORMAL LOW (ref 3.5–5.1)
Sodium: 136 mmol/L (ref 135–145)

## 2020-08-06 LAB — URINALYSIS, COMPLETE (UACMP) WITH MICROSCOPIC
Bilirubin Urine: NEGATIVE
Glucose, UA: NEGATIVE mg/dL
Ketones, ur: NEGATIVE mg/dL
Nitrite: NEGATIVE
Protein, ur: 30 mg/dL — AB
Specific Gravity, Urine: 1.011 (ref 1.005–1.030)
WBC, UA: >50 WBC/hpf — ABNORMAL HIGH (ref 0–5)
pH: 7 (ref 5.0–8.0)

## 2020-08-06 LAB — CBC
HCT: 32.2 % — ABNORMAL LOW (ref 36.0–46.0)
Hemoglobin: 10.6 g/dL — ABNORMAL LOW (ref 12.0–15.0)
MCH: 29.4 pg (ref 26.0–34.0)
MCHC: 32.9 g/dL (ref 30.0–36.0)
MCV: 89.2 fL (ref 80.0–100.0)
Platelets: 349 10*3/uL (ref 150–400)
RBC: 3.61 MIL/uL — ABNORMAL LOW (ref 3.87–5.11)
RDW: 15.4 % (ref 11.5–15.5)
WBC: 14.4 10*3/uL — ABNORMAL HIGH (ref 4.0–10.5)
nRBC: 0 % (ref 0.0–0.2)

## 2020-08-06 LAB — SARS CORONAVIRUS 2 (TAT 6-24 HRS): SARS Coronavirus 2: NEGATIVE

## 2020-08-06 SURGERY — ESOPHAGOGASTRODUODENOSCOPY (EGD) WITH PROPOFOL
Anesthesia: Monitor Anesthesia Care

## 2020-08-06 MED ORDER — PROPOFOL 500 MG/50ML IV EMUL
INTRAVENOUS | Status: DC | PRN
  Administered 2020-08-06: 100 ug/kg/min via INTRAVENOUS

## 2020-08-06 MED ORDER — LIDOCAINE 2% (20 MG/ML) 5 ML SYRINGE
INTRAMUSCULAR | Status: DC | PRN
  Administered 2020-08-06: 80 mg via INTRAVENOUS

## 2020-08-06 MED ORDER — OXYCODONE-ACETAMINOPHEN 5-325 MG PO TABS
1.0000 | ORAL_TABLET | Freq: Four times a day (QID) | ORAL | Status: DC | PRN
  Administered 2020-08-06 – 2020-08-13 (×15): 1 via ORAL
  Filled 2020-08-06 (×15): qty 1

## 2020-08-06 MED ORDER — IPRATROPIUM-ALBUTEROL 0.5-2.5 (3) MG/3ML IN SOLN
3.0000 mL | Freq: Three times a day (TID) | RESPIRATORY_TRACT | Status: DC
  Administered 2020-08-06 – 2020-08-08 (×7): 3 mL via RESPIRATORY_TRACT
  Filled 2020-08-06 (×7): qty 3

## 2020-08-06 MED ORDER — LOSARTAN POTASSIUM 25 MG PO TABS
25.0000 mg | ORAL_TABLET | Freq: Every day | ORAL | Status: DC
  Administered 2020-08-06 – 2020-08-14 (×9): 25 mg via ORAL
  Filled 2020-08-06 (×9): qty 1

## 2020-08-06 MED ORDER — PROPOFOL 10 MG/ML IV BOLUS
INTRAVENOUS | Status: AC
  Filled 2020-08-06: qty 20

## 2020-08-06 MED ORDER — SENNOSIDES-DOCUSATE SODIUM 8.6-50 MG PO TABS
2.0000 | ORAL_TABLET | Freq: Every day | ORAL | Status: DC
  Administered 2020-08-06 – 2020-08-14 (×8): 2 via ORAL
  Filled 2020-08-06 (×9): qty 2

## 2020-08-06 MED ORDER — MELATONIN 5 MG PO TABS
5.0000 mg | ORAL_TABLET | Freq: Every day | ORAL | Status: DC
  Administered 2020-08-06 – 2020-08-13 (×8): 5 mg via ORAL
  Filled 2020-08-06 (×8): qty 1

## 2020-08-06 MED ORDER — FERROUS SULFATE 325 (65 FE) MG PO TABS
325.0000 mg | ORAL_TABLET | Freq: Two times a day (BID) | ORAL | Status: DC
  Administered 2020-08-06 – 2020-08-13 (×14): 325 mg via ORAL
  Filled 2020-08-06 (×14): qty 1

## 2020-08-06 MED ORDER — POTASSIUM CHLORIDE CRYS ER 20 MEQ PO TBCR
40.0000 meq | EXTENDED_RELEASE_TABLET | Freq: Once | ORAL | Status: AC
  Administered 2020-08-06: 40 meq via ORAL
  Filled 2020-08-06: qty 2

## 2020-08-06 MED ORDER — LACTATED RINGERS IV SOLN: INTRAVENOUS | Status: DC | PRN

## 2020-08-06 MED ORDER — ALBUTEROL SULFATE HFA 108 (90 BASE) MCG/ACT IN AERS: 2.0000 | INHALATION_SPRAY | RESPIRATORY_TRACT | Status: DC | PRN

## 2020-08-06 MED ORDER — GABAPENTIN 300 MG PO CAPS
600.0000 mg | ORAL_CAPSULE | Freq: Three times a day (TID) | ORAL | Status: DC
  Administered 2020-08-06 – 2020-08-14 (×25): 600 mg via ORAL
  Filled 2020-08-06 (×25): qty 2

## 2020-08-06 MED ORDER — PANTOPRAZOLE SODIUM 40 MG IV SOLR
40.0000 mg | Freq: Two times a day (BID) | INTRAVENOUS | Status: DC
  Administered 2020-08-06 – 2020-08-10 (×9): 40 mg via INTRAVENOUS
  Filled 2020-08-06 (×9): qty 40

## 2020-08-06 MED ORDER — PROPOFOL 500 MG/50ML IV EMUL
INTRAVENOUS | Status: AC
  Filled 2020-08-06: qty 100

## 2020-08-06 MED ORDER — TRAMADOL HCL 50 MG PO TABS
50.0000 mg | ORAL_TABLET | Freq: Two times a day (BID) | ORAL | Status: DC | PRN
  Administered 2020-08-06 – 2020-08-07 (×2): 50 mg via ORAL
  Filled 2020-08-06 (×2): qty 1

## 2020-08-06 MED ORDER — LORAZEPAM 0.5 MG PO TABS: 0.5000 mg | ORAL_TABLET | Freq: Two times a day (BID) | ORAL | Status: DC | PRN

## 2020-08-06 MED ORDER — PROPOFOL 10 MG/ML IV BOLUS
INTRAVENOUS | Status: DC | PRN
  Administered 2020-08-06: 20 mg via INTRAVENOUS

## 2020-08-06 SURGICAL SUPPLY — 14 items

## 2020-08-06 NOTE — Progress Notes (Signed)
   08/06/20 2323  Assess: MEWS Score  Temp (!) 102.6 F (39.2 C)  BP 120/71  ECG Heart Rate 100  Resp (!) 24  Level of Consciousness Alert  SpO2 98 %  O2 Device Nasal Cannula  O2 Flow Rate (L/min) 3 L/min  Assess: MEWS Score  MEWS Temp 2  MEWS Systolic 0  MEWS Pulse 0  MEWS RR 1  MEWS LOC 0  MEWS Score 3  MEWS Score Color Yellow  Assess: if the MEWS score is Yellow or Red  Were vital signs taken at a resting state? Yes  Focused Assessment No change from prior assessment  Assess: SIRS CRITERIA  SIRS Temperature  1  SIRS Pulse 1  SIRS Respirations  1  SIRS WBC 0  SIRS Score Sum  3  Fever slowing reducing. Lab in to draw blood at this time. Patient informed that urine sample needed for lab. Will await lab results and continue with vitals. Patients chronic pain tolerable. Pt repositioned. Pt comfortable at this time.

## 2020-08-06 NOTE — Anesthesia Preprocedure Evaluation (Addendum)
Anesthesia Evaluation  Patient identified by MRN, date of birth, ID band Patient awake    Reviewed: Allergy & Precautions, NPO status , Patient's Chart, lab work & pertinent test results, reviewed documented beta blocker date and time   Airway Mallampati: II  TM Distance: >3 FB Neck ROM: Full    Dental  (+) Dental Advisory Given, Edentulous Upper, Edentulous Lower   Pulmonary pneumonia, resolved, COPD,  COPD inhaler, Current Smoker and Patient abstained from smoking.,    Pulmonary exam normal breath sounds clear to auscultation       Cardiovascular hypertension, Normal cardiovascular exam Rhythm:Regular Rate:Normal     Neuro/Psych  Headaches,  Neuromuscular disease negative psych ROS   GI/Hepatic Neg liver ROS, hiatal hernia, GERD  Medicated and Controlled,N/V Melena Hematemesis   Endo/Other  negative endocrine ROS  Renal/GU negative Renal ROS  negative genitourinary   Musculoskeletal  (+) Arthritis , Osteoarthritis,    Abdominal   Peds  Hematology  (+) anemia ,   Anesthesia Other Findings   Reproductive/Obstetrics                            Anesthesia Physical Anesthesia Plan  ASA: III  Anesthesia Plan: MAC   Post-op Pain Management:    Induction: Intravenous  PONV Risk Score and Plan: 1 and Treatment may vary due to age or medical condition and Propofol infusion  Airway Management Planned: Natural Airway and Nasal Cannula  Additional Equipment:   Intra-op Plan:   Post-operative Plan:   Informed Consent: I have reviewed the patients History and Physical, chart, labs and discussed the procedure including the risks, benefits and alternatives for the proposed anesthesia with the patient or authorized representative who has indicated his/her understanding and acceptance.     Dental advisory given  Plan Discussed with: Anesthesiologist and CRNA  Anesthesia Plan Comments:          Anesthesia Quick Evaluation

## 2020-08-06 NOTE — Progress Notes (Signed)
[facility] Progress Note  Patient: Anne Carrillo    UTM:[id number]  DOA: 08/05/2020     Date of Service: the patient was seen and examined on 08/06/2020  Brief hospital course: Past medical history of esophageal stricture, COPD, IDA, GERD.  Presents with complaints of nausea vomiting followed by hematemesis and positive Hemoccult. GI consulted, SP EGD.  Shows gastritis Currently plan is monitor H&H and supportive measures.  Assessment and Plan: 1.  Upper GI bleed hematemesis Appreciate GI assistance. Underwent EGD on 6/4. Shows esophageal stenosis with esophagitis. Plan is for full liquid diet and gradually tolerance. Monitor H&H.  Transfuse for hemoglobin less than 7.  2.  Acute blood loss anemia Hemoglobin dropped from 13-10. Likely there is a large component of hemodilution as well. We will continue to monitor.  3.  Chronic dysphagia Appreciate GI assistance. No further work-up for now.  Monitor.  4.  COPD Will provide scheduled inhalers.  5.  Chronic pain syndrome Continue home regimen.  Body mass index is 26.17 kg/m.    Interventions:        Diet: Full liquid diet DVT Prophylaxis:   SCDs Start: 08/05/20 2216    Advance goals of care discussion: Full code  Family Communication: no family was present at bedside, at the time of interview.   Disposition:  Status is: Observation  Dispo: The patient is from: Home              Anticipated d/c is to: Home              Patient currently is not medically stable to d/c.   Difficult to place patient No        Subjective: No nausea no vomiting.  No fever no chills.  Has some shortness of breath and tachypnea.  Physical Exam:  General: Appear in mild distress, no Rash; Oral Mucosa Clear, moist. no Abnormal Neck Mass Or lumps, Conjunctiva normal  Cardiovascular: S1 and S2 Present, no Murmur, Respiratory: good respiratory effort, Bilateral Air entry present and CTA, no Crackles, bilateral expiratory  wheezes Abdomen: Bowel Sound present, Soft and no tenderness Extremities: no Pedal edema Neurology: alert and oriented to time, place, and person affect appropriate. no new focal deficit Gait not checked due to patient safety concerns    Vitals:   08/06/20 1410 08/06/20 1420 08/06/20 1430 08/06/20 1535  BP: 135/85 128/66 136/68 (!) 143/94  Pulse: 94  89   Resp: (!) 23 18 (!) 23 20  Temp:    99 F (37.2 C)  TempSrc:    Oral  SpO2: 92% 97% 97% 100%  Weight:      Height:        Intake/Output Summary (Last 24 hours) at 08/06/2020 1708 Last data filed at 08/06/2020 1404 Gross per 24 hour  Intake 200 ml  Output --  Net 200 ml   Filed Weights   08/05/20 1722  Weight: 60.8 kg    Data Reviewed: I have personally reviewed and interpreted daily labs, tele strips, imaging. I reviewed all nursing notes, pharmacy notes, vitals, pertinent old records I have discussed plan of care as described above with RN and patient/family.  CBC: Recent Labs  Lab 08/05/20 1749 08/06/20 0400  WBC 15.3* 14.4*  HGB 13.2 10.6*  HCT 40.2 32.2*  MCV 89.1 89.2  PLT 409* 349   Basic Metabolic Panel: Recent Labs  Lab 08/05/20 1749 08/06/20 0400  NA 134* 136  K 3.7 3.0*  CL 97* 103  CO2 25  23  GLUCOSE 105* 92  BUN 13 11  CREATININE 0.71 0.84  CALCIUM 9.1 7.9*    Studies: CT ABDOMEN PELVIS WO CONTRAST  Result Date: 08/05/2020 CLINICAL DATA:  Nausea, vomiting, abdominal pain EXAM: CT ABDOMEN AND PELVIS WITHOUT CONTRAST TECHNIQUE: Multidetector CT imaging of the abdomen and pelvis was performed following the standard protocol without IV contrast. COMPARISON:  None. FINDINGS: Lower chest: Moderate-sized hiatal hernia. Calcified granulomas in the right lower lobe. Calcified right hilar lymph nodes. Peripheral noncalcified right lower lobe nodule measures 2 cm on image 32. Hepatobiliary: No focal hepatic abnormality. Gallbladder unremarkable. Pancreas: No focal abnormality or ductal dilatation.  Spleen: Calcifications throughout the spleen.  Normal size. Adrenals/Urinary Tract: No adrenal abnormality. No focal renal abnormality. No stones or hydronephrosis. Urinary bladder is unremarkable. Stomach/Bowel: Normal appendix. Stomach, large and small bowel grossly unremarkable. Vascular/Lymphatic: Aortoiliac atherosclerosis. No evidence of aneurysm or adenopathy. Reproductive: Prior hysterectomy.  No adnexal masses. Other: No free fluid or free air. Musculoskeletal: No acute bony abnormality. Postoperative and degenerative changes in the lumbar spine. IMPRESSION: No acute findings in the abdomen or pelvis. Moderate-sized hiatal hernia. Old granulomatous disease. Smooth oval peripheral 2 cm nodule at the right lung base. This has a benign appearance but is nonspecific. This could be followed with repeat CT in 6 months to assess stability. Electronically Signed   By: Charlett Nose M.D.   On: 08/05/2020 20:49    Scheduled Meds: . ferrous sulfate  325 mg Oral BID WC  . gabapentin  600 mg Oral TID  . losartan  25 mg Oral Daily  . melatonin  5 mg Oral QPC supper  . pantoprazole (PROTONIX) IV  40 mg Intravenous Q12H  . potassium chloride  40 mEq Oral Once  . senna-docusate  2 tablet Oral Daily   Continuous Infusions: PRN Meds: acetaminophen, albuterol, LORazepam, ondansetron (ZOFRAN) IV, oxyCODONE-acetaminophen, traMADol  Time spent: 35 minutes  Author: Lynden Oxford, MD [facility] 08/06/2020 5:08 PM  To reach On-call, see care teams to locate the attending and reach out via [email or web address]. Between 7PM-7AM, please contact night-coverage If you still have difficulty reaching the attending provider, please page the [facility] (Director on Call) for [facility] on amion for assistance.

## 2020-08-06 NOTE — Transfer of Care (Signed)
Immediate Anesthesia Transfer of Care Note  Patient: Anne Carrillo  Procedure(s) Performed: ESOPHAGOGASTRODUODENOSCOPY (EGD) WITH PROPOFOL (N/A )  Patient Location: PACU  Anesthesia Type:MAC  Level of Consciousness: awake, alert , oriented and patient cooperative  Airway & Oxygen Therapy: Patient Spontanous Breathing and Patient connected to face mask oxygen  Post-op Assessment: Report given to RN and Post -op Vital signs reviewed and stable  Post vital signs: Reviewed and stable  Last Vitals:  Vitals Value Taken Time  BP    Temp    Pulse    Resp    SpO2      Last Pain:  Vitals:   08/06/20 1323  TempSrc: Oral  PainSc: 8          Complications: No complications documented.

## 2020-08-06 NOTE — Op Note (Signed)
[facility] Patient Name: Anne Carrillo Procedure Date: 08/06/2020 MRN: [id number] Attending MD: Willis Modena , MD Date of Birth: Dec 03, 1955 CSN: [id number] Age: 65 Admit Type: Inpatient Procedure:                Upper GI endoscopy Indications:              Epigastric abdominal pain, Suspected reflux                            esophagitis, Hematemesis, Nausea with vomiting Providers:                Willis Modena, MD, Blenda Mounts, RN, Arlee Muslim Tech., Technician, Kym Groom, CRNA Referring MD:             [facility] Medicines:                Monitored Anesthesia Care Complications:            No immediate complications. Estimated Blood Loss:     Estimated blood loss: none. Procedure:                Pre-Anesthesia Assessment:                           - Prior to the procedure, a History and Physical                            was performed, and patient medications and                            allergies were reviewed. The patient's tolerance of                            previous anesthesia was also reviewed. The risks                            and benefits of the procedure and the sedation                            options and risks were discussed with the patient.                            All questions were answered, and informed consent                            was obtained. Prior Anticoagulants: The patient has                            taken no previous anticoagulant or antiplatelet                            agents. ASA Grade Assessment: III - A patient with  severe systemic disease. After reviewing the risks                            and benefits, the patient was deemed in                            satisfactory condition to undergo the procedure.                           After obtaining informed consent, the endoscope was                            passed under direct vision.  Throughout the                            procedure, the patient's blood pressure, pulse, and                            oxygen saturations were monitored continuously. The                            GIF-H190 ([id number] Olympus gastroscope was                            introduced through the mouth, and advanced to the                            second part of duodenum. The upper GI endoscopy was                            accomplished without difficulty. The patient                            tolerated the procedure well. Scope In: Scope Out: Findings:      LA Grade C (one or more mucosal breaks continuous between tops of 2 or       more mucosal folds, less than 75% circumference) esophagitis was found.      A medium-sized hiatal hernia was present.      One benign-appearing, intrinsic moderate stenosis was found. The       stenosis was traversed.      The exam of the esophagus was otherwise normal.      The entire examined stomach was normal.      The duodenal bulb, first portion of the duodenum and second portion of       the duodenum were normal. Impression:               - LA Grade C reflux esophagitis.                           - Medium-sized hiatal hernia.                           - Benign-appearing esophageal stenosis.                           -  Normal stomach.                           - Normal duodenal bulb, first portion of the                            duodenum and second portion of the duodenum. Moderate Sedation:      None Recommendation:           - Return patient to hospital ward for ongoing care.                           - Full liquid diet today.                           - Continue present medications.                           - No aspirin, ibuprofen, naproxen, or other                            non-steroidal anti-inflammatory drugs until further                            notice.                           - IV PPI BID x 24 hours.                           [facility] will follow. Hopefully able to discharge                            home in next day or two. Will ultimately need                            repeat EGD +/- DIL as outpatient (given                            comorbidities, and severe COPD, should be done in                            hospital setting). Procedure Code(s):        --- Professional ---                           [phone], Esophagogastroduodenoscopy, flexible,                            transoral; diagnostic, including collection of                            specimen(s) by brushing or washing, when performed                            (separate procedure) Diagnosis Code(s):        ---  Professional ---                           K21.00, Gastro-esophageal reflux disease with                            esophagitis, without bleeding                           K44.9, Diaphragmatic hernia without obstruction or                            gangrene                           K22.2, Esophageal obstruction                           R10.13, Epigastric pain                           K92.0, Hematemesis                           R11.2, Nausea with vomiting, unspecified CPT copyright 2019 American [employer] Association. All rights reserved. The codes documented in this report are preliminary and upon coder review may  be revised to meet current compliance requirements. Willis Modena, MD 08/06/2020 2:04:15 PM This report has been signed electronically. Number of Addenda: 0

## 2020-08-06 NOTE — ED Notes (Signed)
Pt ordered PO potassium, when questioned about allergy Pt stated she remembered "itching a little", but no adverse reaction. When questioned further, Pt stated she would be able to take the PO potassium. Pharmacy aware.

## 2020-08-06 NOTE — Consult Note (Signed)
[facility] Consultation Note  Referring Provider: [facility] Primary Care Physician:  Burton Apley, MD  Reason for Consultation:  Hematemesis  HPI: Anne Carrillo is a 65 y.o. female presenting couple day history of nausea, vomiting epigastric pain. Ultimately developed some melena and hematemesis.  Had ulcer (?) several years ago in [place], reportedly had endoscopy and colonoscopy at that time.  Occasional NSAIDs.  On PPI.  No anticoagulants.   Past Medical History:  Diagnosis Date  . Anemia   . Arthritis   . Carpal tunnel syndrome    Bilateral  . COPD (chronic obstructive pulmonary disease) (HCC)   . GERD (gastroesophageal reflux disease)   . History of hiatal hernia   . Hypertension   . Migraines   . Pneumonia   . Tibia/fibula fracture    Right    Past Surgical History:  Procedure Laterality Date  . ABDOMINAL HYSTERECTOMY    . BACK SURGERY     Screws and plates lower back  . CARPAL TUNNEL RELEASE Right   . COLONOSCOPY    . ECTOPIC PREGNANCY SURGERY  1986  . ORIF TIBIA & FIBULA FRACTURES Right   . TIBIA OSTEOTOMY Right 10/14/2019   Procedure: right tibia fibula osteotomy, takedown malunion, replating;  Surgeon: Eldred Manges, MD;  Location: [facility];  Service: Orthopedics;  Laterality: Right;  . UPPER GI ENDOSCOPY      Prior to Admission medications   Medication Sig Start Date End Date Taking? Authorizing Provider  albuterol (VENTOLIN HFA) 108 (90 Base) MCG/ACT inhaler Inhale 2 puffs into the lungs 4 (four) times daily. 05/19/20  Yes [provider]  cyclobenzaprine (FLEXERIL) 5 MG tablet TAKE 1 TABLET BY MOUTH THREE TIMES DAILY AS NEEDED FOR MUSCLE SPASMS FOR UP TO 10 DAYS   Yes [provider]  ferrous sulfate 325 (65 FE) MG tablet Take 1 tablet (325 mg total) by mouth 2 (two) times daily with a meal. 11/05/19  Yes Medina-Vargas, Monina C, NP  gabapentin (NEURONTIN) 600 MG tablet Take 1 tablet (600 mg total) by mouth 3 (three) times  daily. 11/05/19  Yes Medina-Vargas, Monina C, NP  LORazepam (ATIVAN) 0.5 MG tablet Take 0.5 mg by mouth 2 (two) times daily as needed. 07/08/20  Yes [provider]  losartan (COZAAR) 25 MG tablet Take 25 mg by mouth daily. 05/19/20  Yes [provider]  melatonin 5 MG TABS Take 5 mg by mouth daily. 1 hour after evening meal   Yes [provider]  Nutritional Supplements (ENSURE ACTIVE HIGH PROTEIN) LIQD Take 237 mLs by mouth daily.   Yes [provider]  oxyCODONE-acetaminophen (PERCOCET) 5-325 MG tablet Take 1 tablet by mouth every 6 (six) hours as needed for severe pain. 11/24/19  Yes Medina-Vargas, Monina C, NP  pantoprazole (PROTONIX) 40 MG tablet Take 1 tablet (40 mg total) by mouth daily. Patient taking differently: Take 40 mg by mouth daily as needed. 11/05/19  Yes Medina-Vargas, Monina C, NP  traMADol (ULTRAM) 50 MG tablet Take by mouth every 12 (twelve) hours as needed. 12/11/19  Yes [provider]  methocarbamol (ROBAXIN) 500 MG tablet Take 1 tablet (500 mg total) by mouth 2 (two) times daily as needed for muscle spasms. Patient not taking: No sig reported 01/22/20   Eldred Manges, MD  sennosides-docusate sodium (SENOKOT-S) 8.6-50 MG tablet Take 2 tablets by mouth in the morning and at bedtime.    [provider]    Current Facility-Administered Medications  Medication Dose Route Frequency  Provider Last Rate Last Admin  . [MAR Hold] acetaminophen (TYLENOL) tablet 650 mg  650 mg Oral Q6H PRN Tu, Ching T, DO   650 mg at 08/06/20 0024  . [MAR Hold] albuterol (VENTOLIN HFA) 108 (90 Base) MCG/ACT inhaler 2 puff  2 puff Inhalation Q4H PRN Tu, Ching T, DO      . [MAR Hold] ferrous sulfate tablet 325 mg  325 mg Oral BID WC Tu, Ching T, DO   325 mg at 08/06/20 0913  . [MAR Hold] gabapentin (NEURONTIN) capsule 600 mg  600 mg Oral TID Tu, Ching T, DO   600 mg at 08/06/20 0912  . [MAR Hold] LORazepam (ATIVAN) tablet 0.5 mg  0.5 mg Oral BID PRN Tu,  Ching T, DO      . [MAR Hold] losartan (COZAAR) tablet 25 mg  25 mg Oral Daily Tu, Ching T, DO   25 mg at 08/06/20 0913  . [MAR Hold] melatonin tablet 5 mg  5 mg Oral QPC supper Tu, Ching T, DO      . [MAR Hold] ondansetron (ZOFRAN) injection 4 mg  4 mg Intravenous Q6H PRN Tu, Ching T, DO      . [MAR Hold] oxyCODONE-acetaminophen (PERCOCET/ROXICET) 5-325 MG per tablet 1 tablet  1 tablet Oral Q6H PRN Tu, Ching T, DO      . [MAR Hold] pantoprazole (PROTONIX) injection 40 mg  40 mg Intravenous Q12H Rolly Salter, MD   40 mg at 08/06/20 0913  . [MAR Hold] potassium chloride SA (KLOR-CON) CR tablet 40 mEq  40 mEq Oral Once Rolly Salter, MD      . Mitzi Hansen Hold] senna-docusate (Senokot-S) tablet 2 tablet  2 tablet Oral Daily Tu, Ching T, DO   2 tablet at 08/06/20 0913  . [MAR Hold] traMADol (ULTRAM) tablet 50 mg  50 mg Oral Q12H PRN Tu, Ching T, DO        Allergies as of 08/05/2020 - Review Complete 08/05/2020  Allergen Reaction Noted  . Hydrocodone Other (See Comments) 07/23/2019  . Hydromorphone hcl  07/23/2019  . Potassium  07/23/2019    Family History  Problem Relation Age of Onset  . Heart attack Mother     Social History   Socioeconomic History  . Marital status: Widowed    Spouse name: Not on file  . Number of children: Not on file  . Years of education: Not on file  . Highest education level: Not on file  Occupational History  . Not on file  Tobacco Use  . Smoking status: Current Every Day Smoker    Packs/day: 0.25    Years: 50.00    Pack years: 12.50    Types: Cigarettes  . Smokeless tobacco: Never Used  . Tobacco comment: At times she smoked as much as 2-1/2-3 packs/day.  Vaping Use  . Vaping Use: Never used  Substance and Sexual Activity  . Alcohol use: Yes  . Drug use: Never  . Sexual activity: Not on file  Other Topics Concern  . Not on file  Social History Narrative  . Not on file   Social Determinants of Health   Financial Resource Strain: Not on file   Food Insecurity: Not on file  Transportation Needs: Not on file  Physical Activity: Not on file  Stress: Not on file  Social Connections: Not on file  Intimate Partner Violence: Not on file    Review of Systems: As per HPI, all others negative  Physical Exam: Vital signs  in last 24 hours: Temp:  [98.1 F (36.7 C)] 98.1 F (36.7 C) (06/03 1722) Pulse Rate:  [74-118] 96 (06/04 1323) Resp:  [16-21] 21 (06/04 1323) BP: (112-170)/(64-150) 155/91 (06/04 1323) SpO2:  [48 %-100 %] 100 % (06/04 1323) Weight:  [60.8 kg] 60.8 kg (06/03 1722)   General:   Alert,  Well-developed, well-nourished, pleasant and cooperative in NAD Head:  Normocephalic and atraumatic. Eyes:  Sclera clear, no icterus.   Conjunctiva pink. Ears:  Normal auditory acuity. Nose:  No deformity, discharge,  or lesions. Mouth:  No deformity or lesions.  Oropharynx pink & moist. Neck:  Supple; no masses or thyromegaly. Abdomen:  Soft, mild epigastric tenderness without peritonitis, No masses, hepatosplenomegaly or hernias noted. Normal bowel sounds, without guarding, and without rebound.     Msk:  Symmetrical without gross deformities. Normal posture. Pulses:  Normal pulses noted. Extremities:  Without clubbing or edema. Neurologic:  Alert and  oriented x4;  grossly normal neurologically. Skin:  Intact without significant lesions or rashes. Cervical Nodes:  No significant cervical adenopathy. Psych:  Alert and cooperative. Normal mood and affect.   Lab Results: Recent Labs    08/05/20 1749 08/06/20 0400  WBC 15.3* 14.4*  HGB 13.2 10.6*  HCT 40.2 32.2*  PLT 409* 349   BMET Recent Labs    08/05/20 1749 08/06/20 0400  NA 134* 136  K 3.7 3.0*  CL 97* 103  CO2 25 23  GLUCOSE 105* 92  BUN 13 11  CREATININE 0.71 0.84  CALCIUM 9.1 7.9*   LFT Recent Labs    08/05/20 1749  PROT 8.6*  ALBUMIN 3.8  AST 70*  ALT 41  ALKPHOS 128*  BILITOT 0.5   PT/INR No results for input(s): LABPROT, INR in the  last 72 hours.  Studies/Results: CT ABDOMEN PELVIS WO CONTRAST  Result Date: 08/05/2020 CLINICAL DATA:  Nausea, vomiting, abdominal pain EXAM: CT ABDOMEN AND PELVIS WITHOUT CONTRAST TECHNIQUE: Multidetector CT imaging of the abdomen and pelvis was performed following the standard protocol without IV contrast. COMPARISON:  None. FINDINGS: Lower chest: Moderate-sized hiatal hernia. Calcified granulomas in the right lower lobe. Calcified right hilar lymph nodes. Peripheral noncalcified right lower lobe nodule measures 2 cm on image 32. Hepatobiliary: No focal hepatic abnormality. Gallbladder unremarkable. Pancreas: No focal abnormality or ductal dilatation. Spleen: Calcifications throughout the spleen.  Normal size. Adrenals/Urinary Tract: No adrenal abnormality. No focal renal abnormality. No stones or hydronephrosis. Urinary bladder is unremarkable. Stomach/Bowel: Normal appendix. Stomach, large and small bowel grossly unremarkable. Vascular/Lymphatic: Aortoiliac atherosclerosis. No evidence of aneurysm or adenopathy. Reproductive: Prior hysterectomy.  No adnexal masses. Other: No free fluid or free air. Musculoskeletal: No acute bony abnormality. Postoperative and degenerative changes in the lumbar spine. IMPRESSION: No acute findings in the abdomen or pelvis. Moderate-sized hiatal hernia. Old granulomatous disease. Smooth oval peripheral 2 cm nodule at the right lung base. This has a benign appearance but is nonspecific. This could be followed with repeat CT in 6 months to assess stability. Electronically Signed   By: Charlett Nose M.D.   On: 08/05/2020 20:49   Impression:  1.  Nausea, vomiting. 2.  Hematemesis, melena. 3.  Epigastric abdominal pain.  Plan:  1.  Serial CBCs, IVF, PPI, supportive care. 2.  EGD today. 3.  Risks (bleeding, infection, bowel perforation that could require surgery, sedation-related changes in cardiopulmonary systems), benefits (identification and possible treatment of  source of symptoms, exclusion of certain causes of symptoms), and alternatives (watchful waiting, radiographic imaging studies,  empiric medical treatment) of upper endoscopy (EGD) were explained to patient/family in detail and patient wishes to proceed.   LOS: 0 days   Mayline Dragon M  08/06/2020, 1:28 PM  Cell [phone] If no answer or after 5 PM call [phone]

## 2020-08-06 NOTE — Progress Notes (Signed)
Spoke to [place] in lab to see if urine sample that was sent for UA can be used for culture. Lab to notify if urine sample is not enough to be used. Awaiting pts next void as well. Will continue to monitor. Lab drawn being conducted at this time.

## 2020-08-06 NOTE — Anesthesia Procedure Notes (Signed)
Procedure Name: MAC Date/Time: 08/06/2020 1:39 PM Performed by: West Pugh, CRNA Pre-anesthesia Checklist: Patient identified, Emergency Drugs available, Suction available, Patient being monitored and Timeout performed Patient Re-evaluated:Patient Re-evaluated prior to induction Oxygen Delivery Method: Simple face mask Preoxygenation: Pre-oxygenation with 100% oxygen Induction Type: IV induction Placement Confirmation: positive ETCO2 Dental Injury: Teeth and Oropharynx as per pre-operative assessment

## 2020-08-06 NOTE — Progress Notes (Signed)
   08/06/20 2004  Assess: MEWS Score  Temp (!) 100.9 F (38.3 C)  BP 133/69  Pulse Rate (!) 110  Resp 20  SpO2 98 %  O2 Device Nasal Cannula  Assess: MEWS Score  MEWS Temp 1  MEWS Systolic 0  MEWS Pulse 1  MEWS RR 0  MEWS LOC 0  MEWS Score 2  MEWS Score Color Yellow  Assess: if the MEWS score is Yellow or Red  Were vital signs taken at a resting state? Yes  Focused Assessment No change from prior assessment  Does the patient meet 2 or more of the SIRS criteria? No  MEWS guidelines implemented *See Row Information* Yes  Treat  Pain Scale 0-10  Pain Score 2  Pain Type Acute pain  Pain Location Abdomen  Pain Orientation Right;Anterior;Upper  Pain Descriptors / Indicators Sore  Pain Frequency Intermittent  Pain Onset On-going  Patients Stated Pain Goal 0  Pain Intervention(s) Repositioned;Emotional support  Multiple Pain Sites Yes  2nd Pain Site  Pain Score 7  Pain Type Chronic pain  Pain Location Back  Pain Orientation Lower  Pain Frequency Constant  Pain Onset On-going  Pain Intervention(s) Distraction;Emotional support  Take Vital Signs  Increase Vital Sign Frequency  Yellow: Q 2hr X 2 then Q 4hr X 2, if remains yellow, continue Q 4hrs  Escalate  MEWS: Escalate Yellow: discuss with charge nurse/RN and consider discussing with provider and RRT  Notify: Charge Nurse/RN  Name of Charge Nurse/RN Notified Darla Lesches, RN  Date Charge Nurse/RN Notified 08/06/20  Time Charge Nurse/RN Notified 2018  Document  Progress note created (see row info) Yes  Assess: SIRS CRITERIA  SIRS Temperature  0  SIRS Pulse 1  SIRS Respirations  0  SIRS WBC 0  SIRS Score Sum  1   Yellow MEWS started per protocol. CN notified through secure text. Patient made aware of increased monitoring and it's indication. Will treat for complaints of pain and continue to monitor.

## 2020-08-06 NOTE — Anesthesia Postprocedure Evaluation (Signed)
Anesthesia Post Note  Patient: Anne Carrillo  Procedure(s) Performed: ESOPHAGOGASTRODUODENOSCOPY (EGD) WITH PROPOFOL (N/A )     Patient location during evaluation: PACU Anesthesia Type: MAC Level of consciousness: awake and alert Pain management: pain level controlled Vital Signs Assessment: post-procedure vital signs reviewed and stable Respiratory status: spontaneous breathing, nonlabored ventilation and respiratory function stable Cardiovascular status: stable and blood pressure returned to baseline Postop Assessment: no apparent nausea or vomiting Anesthetic complications: no   No complications documented.  Last Vitals:  Vitals:   08/06/20 1323 08/06/20 1405  BP: (!) 155/91 118/67  Pulse: 96 97  Resp: (!) 21 20  Temp: 36.8 C 37.1 C  SpO2: 100% 98%    Last Pain:  Vitals:   08/06/20 1405  TempSrc: Oral  PainSc: 4                  [name]

## 2020-08-07 DIAGNOSIS — Z20822 Contact with and (suspected) exposure to covid-19: Secondary | ICD-10-CM | POA: Diagnosis present

## 2020-08-07 DIAGNOSIS — M1990 Unspecified osteoarthritis, unspecified site: Secondary | ICD-10-CM | POA: Diagnosis present

## 2020-08-07 DIAGNOSIS — K922 Gastrointestinal hemorrhage, unspecified: Secondary | ICD-10-CM | POA: Diagnosis present

## 2020-08-07 DIAGNOSIS — N390 Urinary tract infection, site not specified: Secondary | ICD-10-CM | POA: Diagnosis present

## 2020-08-07 DIAGNOSIS — R911 Solitary pulmonary nodule: Secondary | ICD-10-CM | POA: Diagnosis present

## 2020-08-07 DIAGNOSIS — K2101 Gastro-esophageal reflux disease with esophagitis, with bleeding: Secondary | ICD-10-CM | POA: Diagnosis present

## 2020-08-07 DIAGNOSIS — R627 Adult failure to thrive: Secondary | ICD-10-CM | POA: Diagnosis present

## 2020-08-07 DIAGNOSIS — F17210 Nicotine dependence, cigarettes, uncomplicated: Secondary | ICD-10-CM | POA: Diagnosis present

## 2020-08-07 DIAGNOSIS — K5900 Constipation, unspecified: Secondary | ICD-10-CM | POA: Diagnosis present

## 2020-08-07 DIAGNOSIS — B9620 Unspecified Escherichia coli [E. coli] as the cause of diseases classified elsewhere: Secondary | ICD-10-CM | POA: Diagnosis present

## 2020-08-07 DIAGNOSIS — R1312 Dysphagia, oropharyngeal phase: Secondary | ICD-10-CM | POA: Diagnosis present

## 2020-08-07 DIAGNOSIS — E876 Hypokalemia: Secondary | ICD-10-CM | POA: Diagnosis present

## 2020-08-07 DIAGNOSIS — I10 Essential (primary) hypertension: Secondary | ICD-10-CM | POA: Diagnosis present

## 2020-08-07 DIAGNOSIS — D62 Acute posthemorrhagic anemia: Secondary | ICD-10-CM | POA: Diagnosis present

## 2020-08-07 DIAGNOSIS — G894 Chronic pain syndrome: Secondary | ICD-10-CM | POA: Diagnosis present

## 2020-08-07 DIAGNOSIS — K920 Hematemesis: Secondary | ICD-10-CM | POA: Diagnosis present

## 2020-08-07 DIAGNOSIS — Z90710 Acquired absence of both cervix and uterus: Secondary | ICD-10-CM | POA: Diagnosis not present

## 2020-08-07 DIAGNOSIS — Z8249 Family history of ischemic heart disease and other diseases of the circulatory system: Secondary | ICD-10-CM | POA: Diagnosis not present

## 2020-08-07 DIAGNOSIS — M899 Disorder of bone, unspecified: Secondary | ICD-10-CM | POA: Diagnosis present

## 2020-08-07 DIAGNOSIS — D509 Iron deficiency anemia, unspecified: Secondary | ICD-10-CM | POA: Diagnosis present

## 2020-08-07 DIAGNOSIS — K921 Melena: Secondary | ICD-10-CM | POA: Diagnosis present

## 2020-08-07 DIAGNOSIS — K222 Esophageal obstruction: Secondary | ICD-10-CM | POA: Diagnosis present

## 2020-08-07 DIAGNOSIS — K449 Diaphragmatic hernia without obstruction or gangrene: Secondary | ICD-10-CM | POA: Diagnosis present

## 2020-08-07 DIAGNOSIS — J441 Chronic obstructive pulmonary disease with (acute) exacerbation: Secondary | ICD-10-CM | POA: Diagnosis present

## 2020-08-07 LAB — RESPIRATORY PANEL BY PCR

## 2020-08-07 LAB — CBC WITH DIFFERENTIAL/PLATELET
Abs Immature Granulocytes: 0.05 10*3/uL (ref 0.00–0.07)
Basophils Absolute: 0.1 10*3/uL (ref 0.0–0.1)
Basophils Relative: 0 %
Eosinophils Absolute: 0.1 10*3/uL (ref 0.0–0.5)
Eosinophils Relative: 1 %
HCT: 33.2 % — ABNORMAL LOW (ref 36.0–46.0)
Hemoglobin: 10.5 g/dL — ABNORMAL LOW (ref 12.0–15.0)
Immature Granulocytes: 0 %
Lymphocytes Relative: 21 %
Lymphs Abs: 2.7 10*3/uL (ref 0.7–4.0)
MCH: 29 pg (ref 26.0–34.0)
MCHC: 31.6 g/dL (ref 30.0–36.0)
MCV: 91.7 fL (ref 80.0–100.0)
Monocytes Absolute: 1.7 10*3/uL — ABNORMAL HIGH (ref 0.1–1.0)
Monocytes Relative: 13 %
Neutro Abs: 8.6 10*3/uL — ABNORMAL HIGH (ref 1.7–7.7)
Neutrophils Relative %: 65 %
Platelets: 293 10*3/uL (ref 150–400)
RBC: 3.62 MIL/uL — ABNORMAL LOW (ref 3.87–5.11)
RDW: 15.4 % (ref 11.5–15.5)
WBC: 13.2 10*3/uL — ABNORMAL HIGH (ref 4.0–10.5)
nRBC: 0 % (ref 0.0–0.2)

## 2020-08-07 LAB — CBC
HCT: 30.8 % — ABNORMAL LOW (ref 36.0–46.0)
Hemoglobin: 10 g/dL — ABNORMAL LOW (ref 12.0–15.0)
MCH: 29 pg (ref 26.0–34.0)
MCHC: 32.5 g/dL (ref 30.0–36.0)
MCV: 89.3 fL (ref 80.0–100.0)
Platelets: 323 10*3/uL (ref 150–400)
RBC: 3.45 MIL/uL — ABNORMAL LOW (ref 3.87–5.11)
RDW: 15.1 % (ref 11.5–15.5)
WBC: 15.6 10*3/uL — ABNORMAL HIGH (ref 4.0–10.5)
nRBC: 0 % (ref 0.0–0.2)

## 2020-08-07 LAB — LACTIC ACID, PLASMA
Lactic Acid, Venous: 1.1 mmol/L (ref 0.5–1.9)
Lactic Acid, Venous: 0.7 mmol/L (ref 0.5–1.9)
Lactic Acid, Venous: 1.2 mmol/L (ref 0.5–1.9)

## 2020-08-07 LAB — BASIC METABOLIC PANEL
Anion gap: 8 (ref 5–15)
BUN: 11 mg/dL (ref 8–23)
CO2: 23 mmol/L (ref 22–32)
Calcium: 8.5 mg/dL — ABNORMAL LOW (ref 8.9–10.3)
Chloride: 102 mmol/L (ref 98–111)
Creatinine, Ser: 0.9 mg/dL (ref 0.44–1.00)
GFR, Estimated: >60 mL/min (ref >60)
Glucose, Bld: 88 mg/dL (ref 70–99)
Potassium: 3.8 mmol/L (ref 3.5–5.1)
Sodium: 133 mmol/L — ABNORMAL LOW (ref 135–145)

## 2020-08-07 MED ORDER — SODIUM CHLORIDE 0.9 % IV SOLN
500.0000 mg | Freq: Once | INTRAVENOUS | Status: AC
  Administered 2020-08-07: 500 mg via INTRAVENOUS
  Filled 2020-08-07: qty 500

## 2020-08-07 MED ORDER — METHYLPREDNISOLONE SODIUM SUCC 125 MG IJ SOLR
60.0000 mg | Freq: Two times a day (BID) | INTRAMUSCULAR | Status: DC
  Administered 2020-08-07 – 2020-08-10 (×7): 60 mg via INTRAVENOUS
  Filled 2020-08-07 (×7): qty 2

## 2020-08-07 MED ORDER — AZITHROMYCIN 250 MG PO TABS
500.0000 mg | ORAL_TABLET | Freq: Every day | ORAL | Status: DC
  Administered 2020-08-08 – 2020-08-14 (×7): 500 mg via ORAL
  Filled 2020-08-07 (×8): qty 2

## 2020-08-07 MED ORDER — DM-GUAIFENESIN ER 30-600 MG PO TB12
1.0000 | ORAL_TABLET | Freq: Two times a day (BID) | ORAL | Status: DC
  Administered 2020-08-07 – 2020-08-11 (×9): 1 via ORAL
  Filled 2020-08-07 (×9): qty 1

## 2020-08-07 MED ORDER — CEPHALEXIN 500 MG PO CAPS
500.0000 mg | ORAL_CAPSULE | Freq: Two times a day (BID) | ORAL | Status: DC
  Administered 2020-08-07 – 2020-08-10 (×6): 500 mg via ORAL
  Filled 2020-08-07 (×7): qty 1

## 2020-08-07 NOTE — Progress Notes (Signed)
Subjective: Less abdominal pain. Not eating very well. Temp 104 last night.  Objective: Vital signs in last 24 hours: Temp:  [97.7 F (36.5 C)-104.2 F (40.1 C)] 98.8 F (37.1 C) (06/05 1020) Pulse Rate:  [78-114] 94 (06/05 1020) Resp:  [18-24] 20 (06/05 1020) BP: (97-155)/(60-94) 108/63 (06/05 1020) SpO2:  [92 %-100 %] 94 % (06/05 1020) Weight:  [64.4 kg] 64.4 kg (06/04 1535) Weight change: 3.618 kg Last BM Date: 08/06/20  PE: GEN:  Chronically ill-appearing, NAD ABD:  Soft, mild epigastric tenderness  Lab Results: CBC    Component Value Date/Time   WBC 13.2 (H) 08/07/2020 0726   RBC 3.62 (L) 08/07/2020 0726   HGB 10.5 (L) 08/07/2020 0726   HCT 33.2 (L) 08/07/2020 0726   PLT 293 08/07/2020 0726   MCV 91.7 08/07/2020 0726   MCH 29.0 08/07/2020 0726   MCHC 31.6 08/07/2020 0726   RDW 15.4 08/07/2020 0726   LYMPHSABS 2.7 08/07/2020 0726   MONOABS 1.7 (H) 08/07/2020 0726   EOSABS 0.1 08/07/2020 0726   BASOSABS 0.1 08/07/2020 0726   CMP     Component Value Date/Time   NA 133 (L) 08/07/2020 0726   NA 138 10/21/2019 0000   K 3.8 08/07/2020 0726   CL 102 08/07/2020 0726   CO2 23 08/07/2020 0726   GLUCOSE 88 08/07/2020 0726   BUN 11 08/07/2020 0726   BUN 11 10/21/2019 0000   CREATININE 0.90 08/07/2020 0726   CALCIUM 8.5 (L) 08/07/2020 0726   PROT 8.6 (H) 08/05/2020 1749   ALBUMIN 3.8 08/05/2020 1749   AST 70 (H) 08/05/2020 1749   ALT 41 08/05/2020 1749   ALKPHOS 128 (H) 08/05/2020 1749   BILITOT 0.5 08/05/2020 1749   GFRNONAA >60 08/07/2020 0726   GFRAA 90 10/21/2019 0000   Assessment:  1.  Fevers and leukocystosis. 2.  Hematemesis. 3.  Abdominal pain. 4.  Esophageal stricture.  Plan:  1.  IV PPI for another 24 hours. 2.  Soft diet. 3.  Respiratory/fever work-up per primary team. 4.  [facility] will follow.   Freddy Jaksch 08/07/2020, 1:17 PM   Cell [phone] If no answer or after 5 PM call [phone]

## 2020-08-07 NOTE — Progress Notes (Signed)
[facility] Progress Note  Patient: Anne Carrillo    XBM:[id number]  DOA: 08/05/2020     Date of Service: the patient was seen and examined on 08/07/2020  Brief hospital course: Past medical history of esophageal stricture, COPD, IDA, GERD.  Presents with complaints of nausea vomiting followed by hematemesis and positive Hemoccult. GI consulted, SP EGD.  Shows severe esophagitis. Overnight on 6/4 patient also had fever and appears to have COPD exacerbation. Currently plan is monitor H&H and treat COPD exacerbation  Assessment and Plan: 1.  Upper GI bleed hematemesis Appreciate GI assistance. Underwent EGD on 6/4. Shows esophageal stenosis with esophagitis. Patient tolerated full liquid diet.  Advancing to soft diet. Continue IV PPI for another 24 hours. Monitor H&H.  Transfuse for hemoglobin less than 7.  2.  Acute blood loss anemia Hemoglobin dropped from 13-10. Likely there is a large component of hemodilution as well. Hemoglobin for now remained stable. We will continue to monitor.  3.  Chronic dysphagia Appreciate GI assistance. No further work-up for now.  Monitor.  4.  COPD with exacerbation Leukocytosis Potential acute bronchitis Patient appears to have mild COPD exacerbation with leukocytosis. Severe fever. COVID-negative. Will check RVP. Add steroids, DuoNebs, prednisone and monitor.  5.  Chronic pain syndrome Continue home regimen.  Body mass index is 26.83 kg/m.   Diet: Soft diet DVT Prophylaxis:   SCDs Start: 08/05/20 2216    Advance goals of care discussion: Full code  Family Communication: no family was present at bedside, at the time of interview.   Disposition:  Status is: Observation  Dispo: The patient is from: Home              Anticipated d/c is to: Home              Patient currently is not medically stable to d/c.   Difficult to place patient No  Subjective: No nausea no vomiting but no fever no chills.  Overnight fever.  No chest  pain abdominal pain.  Continues to have cough.  Physical Exam:  General: Appear in mild distress, no Rash; Oral Mucosa Clear, moist. no Abnormal Neck Mass Or lumps, Conjunctiva normal  Cardiovascular: S1 and S2 Present, no Murmur, Respiratory: increased respiratory effort, Bilateral Air entry present and bilateral  Crackles, bilateral expiratory wheezes Abdomen: Bowel Sound present, Soft and no tenderness Extremities: trace Pedal edema Neurology: alert and oriented to time, place, and person affect appropriate. no new focal deficit Gait not checked due to patient safety concerns   Vitals:   08/07/20 0756 08/07/20 0843 08/07/20 1020 08/07/20 1348  BP:   108/63 122/85  Pulse:   94 100  Resp:   20 20  Temp:   98.8 F (37.1 C) 99.7 F (37.6 C)  TempSrc:   Oral Oral  SpO2: 98% 99% 94% 99%  Weight:      Height:        Intake/Output Summary (Last 24 hours) at 08/07/2020 1524 Last data filed at 08/07/2020 1200 Gross per 24 hour  Intake 250 ml  Output 400 ml  Net -150 ml   Filed Weights   08/05/20 1722 08/06/20 1535  Weight: 60.8 kg 64.4 kg    Data Reviewed: I have personally reviewed and interpreted daily labs, tele strips, imaging. I reviewed all nursing notes, pharmacy notes, vitals, pertinent old records I have discussed plan of care as described above with RN and patient/family.  CBC: Recent Labs  Lab 08/05/20 1749 08/06/20 0400 08/07/20 0005 08/07/20  0726  WBC 15.3* 14.4* 15.6* 13.2*  NEUTROABS  --   --   --  8.6*  HGB 13.2 10.6* 10.0* 10.5*  HCT 40.2 32.2* 30.8* 33.2*  MCV 89.1 89.2 89.3 91.7  PLT 409* 349 323 293   Basic Metabolic Panel: Recent Labs  Lab 08/05/20 1749 08/06/20 0400 08/07/20 0726  NA 134* 136 133*  K 3.7 3.0* 3.8  CL 97* 103 102  CO2 25 23 23   GLUCOSE 105* 92 88  BUN 13 11 11   CREATININE 0.71 0.84 0.90  CALCIUM 9.1 7.9* 8.5*    Studies: No results found.  Scheduled Meds: . dextromethorphan-guaiFENesin  1 tablet Oral BID  .  ferrous sulfate  325 mg Oral BID WC  . gabapentin  600 mg Oral TID  . ipratropium-albuterol  3 mL Nebulization TID  . losartan  25 mg Oral Daily  . melatonin  5 mg Oral QPC supper  . methylPREDNISolone (SOLU-MEDROL) injection  60 mg Intravenous Q12H  . pantoprazole (PROTONIX) IV  40 mg Intravenous Q12H  . senna-docusate  2 tablet Oral Daily   Continuous Infusions: PRN Meds: acetaminophen, albuterol, LORazepam, ondansetron (ZOFRAN) IV, oxyCODONE-acetaminophen, traMADol  Time spent: 35 minutes  Author: , MD [facility] 08/07/2020 3:24 PM  To reach On-call, see care teams to locate the attending and reach out via [email or web address]. Between 7PM-7AM, please contact night-coverage If you still have difficulty reaching the attending provider, please page the [facility] (Director on Call) for [facility] on amion for assistance.

## 2020-08-07 NOTE — Plan of Care (Signed)

## 2020-08-08 ENCOUNTER — Inpatient Hospital Stay (HOSPITAL_COMMUNITY): Payer: Medicare HMO

## 2020-08-08 ENCOUNTER — Encounter (HOSPITAL_COMMUNITY): Payer: Self-pay | Admitting: Gastroenterology

## 2020-08-08 MED ORDER — SUCRALFATE 1 GM/10ML PO SUSP
1.0000 g | Freq: Three times a day (TID) | ORAL | Status: DC
  Administered 2020-08-08 – 2020-08-14 (×23): 1 g via ORAL
  Filled 2020-08-08 (×23): qty 10

## 2020-08-08 NOTE — Plan of Care (Signed)
  Problem: Education: Goal: Knowledge of General Education information will improve Description: Including pain rating scale, medication(s)/side effects and non-pharmacologic comfort measures Outcome: Progressing   Problem: Clinical Measurements: Goal: Respiratory complications will improve Outcome: Progressing   Problem: Activity: Goal: Risk for activity intolerance will decrease Outcome: Progressing   

## 2020-08-08 NOTE — Progress Notes (Signed)
[facility] Progress Note  Anne Carrillo 65 y.o. [birth year]  CC: Hematemesis   Subjective: Patient seen and examined at bedside.  She continues to have epigastric abdominal pain.  Denies nausea or vomiting.  Had 1 episode of black-colored stool yesterday.  ROS : Positive for fever.  Negative for chest pain.   Objective: Vital signs in last 24 hours: Vitals:   08/08/20 0624 08/08/20 0810  BP: 102/65   Pulse: 75   Resp: 20   Temp: 97.8 F (36.6 C)   SpO2: 95% 94%    Physical Exam:  General:  Alert, cooperative, no distress, appears stated age  Head:  Normocephalic, without obvious abnormality, atraumatic, oral mucosa moist  Eyes:  , EOM's intact,   Lungs:    No visible respiratory distress  Heart:  Regular rate and rhythm, S1, S2 normal  Abdomen:   Soft, epigastric tenderness to palpation, nondistended, bowel sounds present.  Extremities: Extremities normal, atraumatic, no  edema  Psych:  Mood and affect normal    Lab Results: Recent Labs    08/06/20 0400 08/07/20 0726  NA 136 133*  K 3.0* 3.8  CL 103 102  CO2 23 23  GLUCOSE 92 88  BUN 11 11  CREATININE 0.84 0.90  CALCIUM 7.9* 8.5*   Recent Labs    08/05/20 1749  AST 70*  ALT 41  ALKPHOS 128*  BILITOT 0.5  PROT 8.6*  ALBUMIN 3.8   Recent Labs    08/07/20 0005 08/07/20 0726  WBC 15.6* 13.2*  NEUTROABS  --  8.6*  HGB 10.0* 10.5*  HCT 30.8* 33.2*  MCV 89.3 91.7  PLT 323 293   No results for input(s): LABPROT, INR in the last 72 hours.    Assessment/Plan: -Hematemesis.  EGD on August 06, 2020 showed LA grade C esophagitis with esophageal stricture and hiatal hernia. -Epigastric abdominal pain.  Probably from esophagitis.  CT abdomen pelvis without contrast was negative for acute changes -Melena.  Probably from esophagitis -Fever. -Abnormal LFTs.  Recommendations ------------------------- -Continue IV twice daily PPI for another 24 hours because of ongoing epigastric abdominal  pain. -Work-up for fever per primary team -Slowly advance diet as tolerated -Repeat LFTs tomorrow -Recommend repeat EGD in 2 to 3 months to document healing of esophagitis and for possible dilation -GI will follow   Kathi Der MD, FACP 08/08/2020, 8:53 AM  Contact #  [phone]

## 2020-08-08 NOTE — Progress Notes (Signed)
[facility] Progress Note  Patient: Anne Carrillo    ZOX:[id number]  DOA: 08/05/2020     Date of Service: the patient was seen and examined on 08/08/2020  Brief hospital course: Past medical history of esophageal stricture, COPD, IDA, GERD.  Presents with complaints of nausea vomiting followed by hematemesis and positive Hemoccult. GI consulted, SP EGD.  Shows severe esophagitis. Overnight on 6/4 patient also had fever and appears to have COPD exacerbation. Currently plan is monitor H&H and treat COPD exacerbation  Assessment and Plan: 1.  Upper GI bleed hematemesis Appreciate GI assistance. Underwent EGD on 6/4. Shows esophageal stenosis with esophagitis. Tolerating soft diet but still has some pain.  We will add Carafate. Continue IV PPI for another 24 hours. Monitor H&H.  Transfuse for hemoglobin less than 7.  2.  Acute blood loss anemia Hemoglobin dropped from 13-10. Likely there is a large component of hemodilution as well. Hemoglobin for now remained stable. We will continue to monitor.  3.  Chronic dysphagia Appreciate GI assistance. No further work-up for now.  Monitor.  4.  COPD with exacerbation Leukocytosis Potential acute bronchitis Patient appears to have mild COPD exacerbation with leukocytosis. Continues to have fever. COVID-negative. Negative respiratory virus pathogen panel. Add steroids, DuoNebs, prednisone and monitor.  5.  Chronic pain syndrome Continue home regimen.  Body mass index is 26.83 kg/m.   Diet: Soft diet DVT Prophylaxis:   SCDs Start: 08/05/20 2216    Advance goals of care discussion: Full code  Family Communication: no family was present at bedside, at the time of interview.   Disposition:  Status is: Observation  Dispo: The patient is from: Home              Anticipated d/c is to: Home              Patient currently is not medically stable to d/c.   Difficult to place patient No  Subjective: Fever yesterday again.  No  further fever since this morning.  No chest pain.  Still has abdominal pain.  Continues to have cough and shortness of breath.  Also reports burning epigastric pain.  Physical Exam:  General: Appear in mild distress, no Rash; Oral Mucosa Clear, moist. no Abnormal Neck Mass Or lumps, Conjunctiva normal  Cardiovascular: S1 and S2 Present, no Murmur, Respiratory: good respiratory effort, Bilateral Air entry present and CTA, no Crackles, bilateral expiratory wheezes Abdomen: Bowel Sound present, Soft and epigastric tenderness Extremities: no Pedal edema Neurology: alert and oriented to time, place, and person affect appropriate. no new focal deficit Gait not checked due to patient safety concerns   Vitals:   08/08/20 0624 08/08/20 0810 08/08/20 1309 08/08/20 1404  BP: 102/65  129/64   Pulse: 75  (!) 107   Resp: 20  20   Temp: 97.8 F (36.6 C)  98.3 F (36.8 C)   TempSrc: Oral  Oral   SpO2: 95% 94% 97% 97%  Weight:      Height:        Intake/Output Summary (Last 24 hours) at 08/08/2020 1911 Last data filed at 08/08/2020 1611 Gross per 24 hour  Intake 360 ml  Output 1500 ml  Net -1140 ml   Filed Weights   08/05/20 1722 08/06/20 1535  Weight: 60.8 kg 64.4 kg    Data Reviewed: I have personally reviewed and interpreted daily labs, tele strips, imaging. I reviewed all nursing notes, pharmacy notes, vitals, pertinent old records I have discussed plan of care as  described above with RN and patient/family.  CBC: Recent Labs  Lab 08/05/20 1749 08/06/20 0400 08/07/20 0005 08/07/20 0726  WBC 15.3* 14.4* 15.6* 13.2*  NEUTROABS  --   --   --  8.6*  HGB 13.2 10.6* 10.0* 10.5*  HCT 40.2 32.2* 30.8* 33.2*  MCV 89.1 89.2 89.3 91.7  PLT 409* 349 323 293   Basic Metabolic Panel: Recent Labs  Lab 08/05/20 1749 08/06/20 0400 08/07/20 0726  NA 134* 136 133*  K 3.7 3.0* 3.8  CL 97* 103 102  CO2 25 23 23   GLUCOSE 105* 92 88  BUN 13 11 11   CREATININE 0.71 0.84 0.90  CALCIUM 9.1  7.9* 8.5*    Studies: DG Chest 2 View  Result Date: 08/08/2020 CLINICAL DATA:  Shortness of breath EXAM: CHEST - 2 VIEW COMPARISON:  None FINDINGS: There are calcified granulomas in the right lower lobe. There is slight scarring in the left base. No edema or airspace opacity. Heart size and pulmonary vascularity are normal. No adenopathy. There is aortic atherosclerosis. Small hiatal hernia. There is a destructive lesion in the proximal left humerus with advanced erosion of the left humeral head. IMPRESSION: Calcified granulomas on the right. Slight scarring left base. No edema or airspace opacity. Heart size normal. Aortic Atherosclerosis (ICD10-I70.0). Small hiatal hernia. Destructive lesion proximal left humerus with advanced erosion in this area. Underlying infection or neoplasm could present in this manner. Electronically Signed   By: III M.D.   On: 08/08/2020 13:06    Scheduled Meds: . azithromycin  500 mg Oral Daily  . cephALEXin  500 mg Oral Q12H  . dextromethorphan-guaiFENesin  1 tablet Oral BID  . ferrous sulfate  325 mg Oral BID WC  . gabapentin  600 mg Oral TID  . ipratropium-albuterol  3 mL Nebulization TID  . losartan  25 mg Oral Daily  . melatonin  5 mg Oral QPC supper  . methylPREDNISolone (SOLU-MEDROL) injection  60 mg Intravenous Q12H  . pantoprazole (PROTONIX) IV  40 mg Intravenous Q12H  . senna-docusate  2 tablet Oral Daily  . sucralfate  1 g Oral TID WC & HS   Continuous Infusions: PRN Meds: acetaminophen, albuterol, LORazepam, ondansetron (ZOFRAN) IV, oxyCODONE-acetaminophen, traMADol  Time spent: 35 minutes  Author: Bretta Bang, MD [facility] 08/08/2020 7:11 PM  To reach On-call, see care teams to locate the attending and reach out via [email or web address]. Between 7PM-7AM, please contact night-coverage If you still have difficulty reaching the attending provider, please page the [facility] (Director on Call) for [facility] on amion for  assistance.

## 2020-08-09 ENCOUNTER — Inpatient Hospital Stay (HOSPITAL_COMMUNITY): Payer: Medicare HMO

## 2020-08-09 LAB — COMPREHENSIVE METABOLIC PANEL
ALT: 45 U/L — ABNORMAL HIGH (ref 0–44)
AST: 31 U/L (ref 15–41)
Albumin: 2.8 g/dL — ABNORMAL LOW (ref 3.5–5.0)
Alkaline Phosphatase: 102 U/L (ref 38–126)
Anion gap: 9 (ref 5–15)
BUN: 14 mg/dL (ref 8–23)
CO2: 21 mmol/L — ABNORMAL LOW (ref 22–32)
Calcium: 9 mg/dL (ref 8.9–10.3)
Chloride: 106 mmol/L (ref 98–111)
Creatinine, Ser: 0.66 mg/dL (ref 0.44–1.00)
GFR, Estimated: >60 mL/min (ref >60)
Glucose, Bld: 257 mg/dL — ABNORMAL HIGH (ref 70–99)
Potassium: 4.4 mmol/L (ref 3.5–5.1)
Sodium: 136 mmol/L (ref 135–145)
Total Bilirubin: 0.1 mg/dL — ABNORMAL LOW (ref 0.3–1.2)
Total Protein: 6.7 g/dL (ref 6.5–8.1)

## 2020-08-09 LAB — CBC
HCT: 31.5 % — ABNORMAL LOW (ref 36.0–46.0)
Hemoglobin: 10 g/dL — ABNORMAL LOW (ref 12.0–15.0)
MCH: 29 pg (ref 26.0–34.0)
MCHC: 31.7 g/dL (ref 30.0–36.0)
MCV: 91.3 fL (ref 80.0–100.0)
Platelets: 378 10*3/uL (ref 150–400)
RBC: 3.45 MIL/uL — ABNORMAL LOW (ref 3.87–5.11)
RDW: 15.6 % — ABNORMAL HIGH (ref 11.5–15.5)
WBC: 17.2 10*3/uL — ABNORMAL HIGH (ref 4.0–10.5)
nRBC: 0 % (ref 0.0–0.2)

## 2020-08-09 LAB — URINE CULTURE: Culture: >=100000 — AB

## 2020-08-09 MED ORDER — ALBUTEROL SULFATE (2.5 MG/3ML) 0.083% IN NEBU: 2.5000 mg | INHALATION_SOLUTION | RESPIRATORY_TRACT | Status: DC | PRN

## 2020-08-09 NOTE — Progress Notes (Signed)
Alert and oriented x 4. Denies pain or discomfort. Needs 1 person assist with transfers and standby assist with ambulation. Continues to report black stools, no other complaints. Will continue plan of care.

## 2020-08-09 NOTE — Evaluation (Signed)
Physical Therapy Evaluation Patient Details Name: Anne Carrillo MRN: [id number] DOB: Sep 15, 1955 Today's Date: 08/09/2020   History of Present Illness  Pt is 65 yo female who presented with nausea/vomitting/hematemesis on 08/05/20.  Pt admitted with upper GI bleed and anemia.  GI consulted performed EGD 08/06/20 and pt with severe esophagitis.  Also with COPD exacerbation. Pt with medical hx of esophageal stricture, COPD, IDA, and GERD, and R tibia osteotomy 10/14/19    Clinical Impression  Pt admitted with above diagnosis. Pt was able to ambulate 100'x2 with standing rest break.  Her oxygen was 98% throughout on RA.  Pt did not use AD and demonstrated stable gait.  She does live alone but can have intermittent assist from cousin as needed.  From PT perspective, demonstrates mobility necessary to return home (ambulating household distances safely) with intermittent support, but will benefit from HHPT to advance to her baseline as she currently easily fatigues. Pt currently with functional limitations due to the deficits listed below (see PT Problem List). Pt will benefit from skilled PT to increase their independence and safety with mobility to allow discharge to the venue listed below.       Follow Up Recommendations Home health PT;Supervision - Intermittent    Equipment Recommendations  None recommended by PT    Recommendations for Other Services       Precautions / Restrictions Precautions Precautions: None      Mobility  Bed Mobility Overal bed mobility: Needs Assistance Bed Mobility: Supine to Sit;Sit to Supine     Supine to sit: Supervision Sit to supine: Supervision        Transfers Overall transfer level: Needs assistance Equipment used: Rolling walker (2 wheeled) Transfers: Sit to/from Stand Sit to Stand: Supervision            Ambulation/Gait Ambulation/Gait assistance: Min guard;Supervision Gait Distance (Feet): 100 Feet (100'x2) Assistive device: None Gait  Pattern/deviations: Step-through pattern;Decreased stride length Gait velocity: decreased   General Gait Details: Pt ambulated 100'x2 with standing rest break.  She had DOE of 2/4 but O2 sats 98%.  Gait was steady without LOB  Stairs            Wheelchair Mobility    Modified Rankin (Stroke Patients Only)       Balance Overall balance assessment: Needs assistance Sitting-balance support: No upper extremity supported Sitting balance-Leahy Scale: Normal     Standing balance support: No upper extremity supported Standing balance-Leahy Scale: Good                               Pertinent Vitals/Pain Pain Assessment: 0-10 Pain Score: 3  Pain Location: stomach Pain Descriptors / Indicators: Discomfort Pain Intervention(s): Limited activity within patient's tolerance;Monitored during session    Home Living Family/patient expects to be discharged to:: Private residence Living Arrangements: Alone Available Help at Discharge: Family;Available PRN/intermittently Cousin lives next door and can assist Type of Home: Mobile home Home Access: Stairs to enter Entrance Stairs-Rails: Right Entrance Stairs-Number of Steps: 5 Home Layout: One level Home Equipment: Walker - 2 wheels;Cane - single point;Crutches;Wheelchair - manual      Prior Function Level of Independence: Needs assistance   Gait / Transfers Assistance Needed: Walks without AD; reports holds on furniture if needs to but doesn't usually need to.  Had recent sx to "straighten R leg" and reports moving much better since that; only able to ambulate short community distances (uses motorized scooter at  store)  ADL's / Homemaking Assistance Needed: Report independent with ADLs and IADLs. Does not drive  Comments: No falls since leg surgery in August other than right before admission due to nausea/vomitting/weak     Hand Dominance   Dominant Hand: Right    Extremity/Trunk Assessment   Upper Extremity  Assessment Upper Extremity Assessment: LUE deficits/detail;RUE deficits/detail RUE Deficits / Details: WNL LUE Deficits / Details: ROM WFL (PROM on shoulder); Shoulder only 1/5 active strength baseline    Lower Extremity Assessment Lower Extremity Assessment: LLE deficits/detail;RLE deficits/detail RLE Deficits / Details: ROM WFL; MMT 5/5; reports much better since surgery in August LLE Deficits / Details: ROM WFL; MMT 5/5    Cervical / Trunk Assessment Cervical / Trunk Assessment: Normal  Communication   Communication: No difficulties  Cognition Arousal/Alertness: Awake/alert Behavior During Therapy: WFL for tasks assessed/performed Overall Cognitive Status: Within Functional Limits for tasks assessed                                        General Comments General comments (skin integrity, edema, etc.): Pt on RA.  O2 sats 98% or greater throughout session.  HR 108 bpm with activity.  Educated on PT role, POC, and safety    Exercises     Assessment/Plan    PT Assessment Patient needs continued PT services  PT Problem List Decreased strength;Decreased mobility;Decreased activity tolerance;Cardiopulmonary status limiting activity;Decreased balance;Decreased knowledge of use of DME       PT Treatment Interventions DME instruction;Therapeutic activities;Gait training;Therapeutic exercise;Patient/family education;Stair training;Balance training;Functional mobility training    PT Goals (Current goals can be found in the Care Plan section)  Acute Rehab PT Goals Patient Stated Goal: return home; get stronger PT Goal Formulation: With patient Time For Goal Achievement: 08/23/20 Potential to Achieve Goals: Good    Frequency Min 3X/week   Barriers to discharge        Co-evaluation               AM-PAC PT "6 Clicks" Mobility  Outcome Measure Help needed turning from your back to your side while in a flat bed without using bedrails?: None Help needed  moving from lying on your back to sitting on the side of a flat bed without using bedrails?: None Help needed moving to and from a bed to a chair (including a wheelchair)?: A Little Help needed standing up from a chair using your arms (e.g., wheelchair or bedside chair)?: A Little Help needed to walk in hospital room?: A Little Help needed climbing 3-5 steps with a railing? : A Little 6 Click Score: 20    End of Session Equipment Utilized During Treatment: Gait belt Activity Tolerance: Patient tolerated treatment well Patient left: in bed;with call bell/phone within reach;with bed alarm set Nurse Communication: Mobility status PT Visit Diagnosis: Other abnormalities of gait and mobility (R26.89)    Time: 1610-9604 PT Time Calculation (min) (ACUTE ONLY): 20 min   Charges:   PT Evaluation $PT Eval Low Complexity: 1 Low          Kepler Mccabe, PT Acute Rehab Services Pager [phone] [facility] [phone]    Rayetta Humphrey 08/09/2020, 1:34 PM

## 2020-08-09 NOTE — Progress Notes (Signed)
[facility] Gastroenterology Progress Note  Anne Carrillo 65 y.o. [birth year]  CC: Hematemesis   Subjective: Patient seen and examined at bedside.  She continues to have epigastric abdominal pain.  Continues to have intermittent black stools.  ROS : Negative for chest pain and shortness of breath.   Objective: Vital signs in last 24 hours: Vitals:   08/08/20 2049 08/09/20 0602  BP:  139/89  Pulse:  93  Resp:  (!) 21  Temp:  98.7 F (37.1 C)  SpO2: 97% 96%    Physical Exam:  General:  Alert, cooperative, no distress, appears stated age  Head:  Normocephalic, without obvious abnormality, atraumatic, oral mucosa moist  Eyes:  , EOM's intact,   Lungs:    No visible respiratory distress  Heart:  Regular rate and rhythm, S1, S2 normal  Abdomen:   Soft, epigastric discomfort without significant tenderness, nondistended, bowel sounds present.  Extremities: Extremities normal, atraumatic, no  edema  Psych:  Mood and affect normal    Lab Results: Recent Labs    08/07/20 0726 08/09/20 0401  NA 133* 136  K 3.8 4.4  CL 102 106  CO2 23 21*  GLUCOSE 88 257*  BUN 11 14  CREATININE 0.90 0.66  CALCIUM 8.5* 9.0   Recent Labs    08/09/20 0401  AST 31  ALT 45*  ALKPHOS 102  BILITOT 0.1*  PROT 6.7  ALBUMIN 2.8*   Recent Labs    08/07/20 0726 08/09/20 0401  WBC 13.2* 17.2*  NEUTROABS 8.6*  --   HGB 10.5* 10.0*  HCT 33.2* 31.5*  MCV 91.7 91.3  PLT 293 378   No results for input(s): LABPROT, INR in the last 72 hours.    Assessment/Plan: -Hematemesis.  EGD on August 06, 2020 showed LA grade C esophagitis with esophageal stricture and hiatal hernia. -Epigastric abdominal pain.  Probably from esophagitis.  CT abdomen pelvis without contrast was negative for acute changes -Melena.  Probably from esophagitis -Fever. -Abnormal LFTs.  LFTs improving.  Recommendations ------------------------- -Patient's hemoglobin is stable.  LFTs improving. -Continue IV twice daily PPI  while in the hospital.  Switch to p.o. Protonix 40 mg twice a day for 8 weeks followed by Protonix 40 mg once a day until repeat endoscopy. -Continue Carafate for 4 weeks - Recommend repeat EGD in 2 to 3 months to document healing of esophagitis and for possible dilation -Follow-up in GI clinic in 6 weeks after discharge -GI will sign off.  Call us back if needed.   Kathi Der MD, FACP 08/09/2020, 9:38 AM  Contact #  [phone]

## 2020-08-09 NOTE — Progress Notes (Addendum)
[facility] Progress Note  Patient: Anne Carrillo    RJJ:[id number]  DOA: 08/05/2020     Date of Service: the patient was seen and examined on 08/09/2020  Brief hospital course: Past medical history of esophageal stricture, COPD, IDA, GERD.  Presents with complaints of nausea vomiting followed by hematemesis and positive Hemoccult. GI consulted, SP EGD.  Shows severe esophagitis. Overnight on 6/4 patient also had fever and appears to have COPD exacerbation. Currently plan is monitor H&H and treat COPD exacerbation  Assessment and Plan: 1.  Upper GI bleed hematemesis Severe esophagitis Appreciate GI assistance. Underwent EGD on 6/4. Shows esophageal stenosis with severe esophagitis. Tolerating soft diet but still has some pain.  Continue Carafate. Continue IV PPI while in the hospital. Monitor H&H.  Transfuse for hemoglobin less than 7. X-ray abdomen was performed due to patient's persistent abdominal pain syndrome which is negative for any acute abnormality. GI currently signed off. Switch to p.o. Protonix 40 mg twice a day for 8 weeks followed by 40 mg once a day until repeat endoscopy. Continue Carafate for 4 weeks. Repeat EGD in 2 to 3 months. Follow-up with GI in 6 weeks.  2.  Acute blood loss anemia Hemoglobin dropped from 13-10. Likely there is a large component of hemodilution as well. Hemoglobin for now remained stable. We will continue to monitor.  3.  Chronic dysphagia Appreciate GI assistance. No further work-up for now.  Monitor.  4.  COPD with exacerbation Leukocytosis Potential acute bronchitis Patient appears to have mild COPD exacerbation with leukocytosis. Continues to have fever. COVID-negative. Negative respiratory virus pathogen panel. Add steroids, DuoNebs, prednisone and monitor.  5.  Chronic pain syndrome Continue home regimen.  6.  E. coli UTI. Patient is on oral cephalosporin.  Continue.  Body mass index is 26.83 kg/m.   Diet: Soft  diet DVT Prophylaxis:   SCDs Start: 08/05/20 2216    Advance goals of care discussion: Full code  Family Communication: no family was present at bedside, at the time of interview.   Disposition:  Status is: Inpatient  Dispo: The patient is from: Home              Anticipated d/c is to: Home              Patient currently is not medically stable to d/c.   Difficult to place patient No  Subjective: Fever curve improving.  No nausea no vomiting.  Continues to have cough and shortness of breath.  No chest pain.  Reports severe abdominal pain.  Physical Exam:  General: Appear in mild distress, no Rash; Oral Mucosa Clear, moist. no Abnormal Neck Mass Or lumps, Conjunctiva normal  Cardiovascular: S1 and S2 Present, no Murmur, Respiratory: good respiratory effort, Bilateral Air entry present and CTA, no Crackles, bilateral wheezes Abdomen: Bowel Sound present, Soft and mild diffuse tenderness Extremities: no Pedal edema Neurology: alert and oriented to time, place, and person affect appropriate. no new focal deficit Gait not checked due to patient safety concerns   Vitals:   08/08/20 2036 08/08/20 2049 08/09/20 0602 08/09/20 1433  BP: 135/74  139/89 (!) 154/98  Pulse: 93  93 92  Resp: 18  (!) 21 20  Temp: 97.9 F (36.6 C)  98.7 F (37.1 C) 98 F (36.7 C)  TempSrc: Oral  Oral Oral  SpO2: 97% 97% 96% 96%  Weight:      Height:        Intake/Output Summary (Last 24 hours) at 08/09/2020 2001  Last data filed at 08/09/2020 0949 Gross per 24 hour  Intake 240 ml  Output 700 ml  Net -460 ml   Filed Weights   08/05/20 1722 08/06/20 1535  Weight: 60.8 kg 64.4 kg    Data Reviewed: I have personally reviewed and interpreted daily labs, tele strips, imaging. I reviewed all nursing notes, pharmacy notes, vitals, pertinent old records I have discussed plan of care as described above with RN and patient/family.  CBC: Recent Labs  Lab 08/05/20 1749 08/06/20 0400 08/07/20 0005  08/07/20 0726 08/09/20 0401  WBC 15.3* 14.4* 15.6* 13.2* 17.2*  NEUTROABS  --   --   --  8.6*  --   HGB 13.2 10.6* 10.0* 10.5* 10.0*  HCT 40.2 32.2* 30.8* 33.2* 31.5*  MCV 89.1 89.2 89.3 91.7 91.3  PLT 409* 349 323 293 378   Basic Metabolic Panel: Recent Labs  Lab 08/05/20 1749 08/06/20 0400 08/07/20 0726 08/09/20 0401  NA 134* 136 133* 136  K 3.7 3.0* 3.8 4.4  CL 97* 103 102 106  CO2 25 23 23  21*  GLUCOSE 105* 92 88 257*  BUN 13 11 11 14   CREATININE 0.71 0.84 0.90 0.66  CALCIUM 9.1 7.9* 8.5* 9.0    Studies: DG Abd 2 Views  Result Date: 08/09/2020 CLINICAL DATA:  Abdominal pain EXAM: ABDOMEN - 2 VIEW COMPARISON:  CT abdomen pelvis 08/05/2020 FINDINGS: No evidence of bowel obstruction. No radiopaque calculi overlying the renal shadows. Calcified granuloma overlies the right lung base. Scoliotic curvature of the spine with prior lumbar fusion from L3-L5 and severe degenerative disc changes above the fusion from T12-L3. IMPRESSION: No evidence of bowel obstruction or other acute abnormality. Electronically Signed   By: 10/09/2020   On: 08/09/2020 13:48    Scheduled Meds: . azithromycin  500 mg Oral Daily  . cephALEXin  500 mg Oral Q12H  . dextromethorphan-guaiFENesin  1 tablet Oral BID  . ferrous sulfate  325 mg Oral BID WC  . gabapentin  600 mg Oral TID  . losartan  25 mg Oral Daily  . melatonin  5 mg Oral QPC supper  . methylPREDNISolone (SOLU-MEDROL) injection  60 mg Intravenous Q12H  . pantoprazole (PROTONIX) IV  40 mg Intravenous Q12H  . senna-docusate  2 tablet Oral Daily  . sucralfate  1 g Oral TID WC & HS   Continuous Infusions: PRN Meds: acetaminophen, albuterol, LORazepam, ondansetron (ZOFRAN) IV, oxyCODONE-acetaminophen, traMADol  Time spent: 35 minutes  Author: Caprice Renshaw, MD [facility] 08/09/2020 8:01 PM  To reach On-call, see care teams to locate the attending and reach out via [email or web address]. Between 7PM-7AM, please contact night-coverage If  you still have difficulty reaching the attending provider, please page the [facility] (Director on Call) for [facility] on amion for assistance.

## 2020-08-09 NOTE — TOC Progression Note (Signed)
Transition of Care [facility]) - Progression Note    Patient Details  Name: Anne Carrillo MRN: [id number] Date of Birth: January 19, 1956  Transition of Care [facility]) CM/SW Contact  Geni Bers, RN Phone Number: 08/09/2020, 11:01 AM  Clinical Narrative:    Pt from home alone. TOC will continue to follow for [facility] needs.    Expected Discharge Plan: Home w Home Health Services Barriers to Discharge: No Barriers Identified  Expected Discharge Plan and Services Expected Discharge Plan: Home w Home Health Services       Living arrangements for the past 2 months: Single Family Home                                       Social Determinants of Health (SDOH) Interventions    Readmission Risk Interventions No flowsheet data found.

## 2020-08-09 NOTE — TOC Progression Note (Signed)
Transition of Care [facility]) - Progression Note    Patient Details  Name: Anne Carrillo MRN: [id number] Date of Birth: April 12, 1955  Transition of Care [facility]) CM/SW Contact  Geni Bers, RN Phone Number: 08/09/2020, 1:28 PM  Clinical Narrative:     Spoke with pt concerning discharge plans. [facility] was selected for HHPT. Referral given to in house rep.  Expected Discharge Plan: Home w Home Health Services Barriers to Discharge: No Barriers Identified  Expected Discharge Plan and Services Expected Discharge Plan: Home w Home Health Services       Living arrangements for the past 2 months: Single Family Home                                       Social Determinants of Health (SDOH) Interventions    Readmission Risk Interventions No flowsheet data found.

## 2020-08-10 MED ORDER — PREDNISONE 5 MG PO TABS
50.0000 mg | ORAL_TABLET | Freq: Every day | ORAL | Status: DC
  Administered 2020-08-11 – 2020-08-14 (×4): 50 mg via ORAL
  Filled 2020-08-10 (×4): qty 2

## 2020-08-10 MED ORDER — PANTOPRAZOLE SODIUM 40 MG PO TBEC
40.0000 mg | DELAYED_RELEASE_TABLET | Freq: Two times a day (BID) | ORAL | Status: DC
  Administered 2020-08-10 – 2020-08-14 (×8): 40 mg via ORAL
  Filled 2020-08-10 (×8): qty 1

## 2020-08-10 NOTE — Progress Notes (Signed)
PROGRESS NOTE    Anne Carrillo  HER:[id number] DOB: November 13, 1955 DOA: 08/05/2020 PCP: Burton Apley, MD    Chief Complaint  Patient presents with  . Abdominal Pain  . Emesis    Brief Narrative:  Past medical history of esophageal stricture, COPD, IDA, GERD.  Presents with complaints of nausea vomiting followed by hematemesis and positive Hemoccult Also treated for COPD exacerbation  Subjective:   Still feeling weak, has congested cough, feeling sob at rest started soft diet yesterday, has dark stool this am Does not feel like she can go home today  Assessment & Plan:   Principal Problem:   Acute GI bleeding Active Problems:   Oropharyngeal dysphagia   COPD (chronic obstructive pulmonary disease) (HCC)   Lung nodule   Chronic pain   GI bleed   LA grade C esophagitis with esophageal stricture and hiatal hernia -Presented with hematemesis/abdominal pain, CT abdomen pelvis without contrast on presentation negative for acute changes -S/p EGD on 6/4  by Dr. Dulce Sellar -Received IV PPI twice a day, currently on Protonix oral twice a day, plan for total of 8 weeks then followed by Protonix 40 mg once a day until repeat endoscopy -Continue Carafate for 4 weeks -Repeat EGD in 2 to 36-month to document healing of esophagitis and possible need esophageal dilation -Follow-up with [facility] in 6 weeks  Acute blood loss anemia Hemoglobin dropped from 13-10, also have component of hemodilution as well Hemoglobin stable at 10 She did not require blood transfusion Reports stool is still dark Will check anemia panel Currently on oral iron supplement  Elevated LFT CT abdomen pelvis without contrast no acute findings LFT normalized  COPD exacerbation With leukocytosis/fever on 6/ 4 Chest x-ray on 6/6"Calcified granulomas on the right. Slight scarring left base. No edema or airspace opacity" COVID screening negative Negative respiratory viral panel No wheezing today, DC IV steroid,  changed to prednisone taper, continue Zithromax, Mucinex No hypoxia at rest, home O2 evaluation ordered on 6/7, I cannot locate the result, will reorder home O2 eval  UTI Blood culture no growth Urine culture positive for pansensitive E. Coli, she received 3 days of Rocephin, DC Rocephin today   Hypertension Stable on home medication Cozaar   FTT: , Physical therapy ordered, pending evaluation, will likely need home health PT   The patient's BMI is: Body mass index is 26.83 kg/m.Marland Kitchen     Unresulted Labs (From admission, onward)          Start     Ordered   08/11/20 0500  CBC  Tomorrow morning,   R        08/10/20 2028   08/11/20 0500  Basic metabolic panel  Tomorrow morning,   R        08/10/20 2028   08/11/20 0500  Iron and TIBC  Tomorrow morning,   R        08/10/20 2031   08/11/20 0500  Reticulocytes  Tomorrow morning,   R        08/10/20 2031   08/11/20 0500  Vitamin B12  Tomorrow morning,   R        08/10/20 2031   08/11/20 0500  Folate, serum, performed at [facility] lab  Tomorrow morning,   R        08/10/20 2031            DVT prophylaxis: SCDs Start: 08/05/20 2216   Code Status: Full Family Communication: Patient Disposition:   Status is: Inpatient  Dispo: The patient is from: Home              Anticipated d/c is to: Home, likely need home health, pending PT eval              Anticipated d/c date is: Likely 6/9                Consultants:  [facility] Procedures:   EGD  Antimicrobials:   Anti-infectives (From admission, onward)   Start     Dose/Rate Route Frequency Ordered Stop   08/08/20 1000  azithromycin (ZITHROMAX) tablet 500 mg        500 mg Oral Daily 08/07/20 1543     08/07/20 1630  cephALEXin (KEFLEX) capsule 500 mg  Status:  Discontinued        500 mg Oral Every 12 hours 08/07/20 1543 08/10/20 1303   08/07/20 0915  azithromycin (ZITHROMAX) 500 mg in sodium chloride 0.9 % 250 mL IVPB        500 mg 250 mL/hr over 60 Minutes  Intravenous  Once 08/07/20 0823 08/07/20 1230          Objective: Vitals:   08/09/20 2110 08/10/20 0039 08/10/20 0555 08/10/20 1341  BP: (!) 153/92 (!) 145/81 125/89 (!) 159/89  Pulse: 88 90 78 85  Resp: 17 16 16 18   Temp: 98.7 F (37.1 C) 98.5 F (36.9 C) 97.9 F (36.6 C) 97.6 F (36.4 C)  TempSrc: Oral Oral Oral Oral  SpO2: 98% 97% 97% 97%  Weight:      Height:        Intake/Output Summary (Last 24 hours) at 08/10/2020 2045 Last data filed at 08/10/2020 1022 Gross per 24 hour  Intake 460 ml  Output --  Net 460 ml   Filed Weights   08/05/20 1722 08/06/20 1535  Weight: 60.8 kg 64.4 kg    Examination:  General exam: Frail, chronic ill-appearing, calm, NAD Respiratory system: Overall diminished, but no wheezing, no rales, no rhonchi . Respiratory effort normal. Cardiovascular system: S1 & S2 heard, RRR. No JVD, no murmur, No pedal edema. Gastrointestinal system: Abdomen is nondistended, soft and nontender.  Normal bowel sounds heard. Central nervous system: Alert and oriented. No focal neurological deficits. Extremities: Generalized weakness, no edema Skin: No rashes, lesions or ulcers Psychiatry: Judgement and insight appear normal. Mood & affect appropriate.     Data Reviewed: I have personally reviewed following labs and imaging studies  CBC: Recent Labs  Lab 08/05/20 1749 08/06/20 0400 08/07/20 0005 08/07/20 0726 08/09/20 0401  WBC 15.3* 14.4* 15.6* 13.2* 17.2*  NEUTROABS  --   --   --  8.6*  --   HGB 13.2 10.6* 10.0* 10.5* 10.0*  HCT 40.2 32.2* 30.8* 33.2* 31.5*  MCV 89.1 89.2 89.3 91.7 91.3  PLT 409* 349 323 293 378    Basic Metabolic Panel: Recent Labs  Lab 08/05/20 1749 08/06/20 0400 08/07/20 0726 08/09/20 0401  NA 134* 136 133* 136  K 3.7 3.0* 3.8 4.4  CL 97* 103 102 106  CO2 25 23 23  21*  GLUCOSE 105* 92 88 257*  BUN 13 11 11 14   CREATININE 0.71 0.84 0.90 0.66  CALCIUM 9.1 7.9* 8.5* 9.0    GFR: Estimated Creatinine Clearance:  60.2 mL/min (by C-G formula based on SCr of 0.66 mg/dL).  Liver Function Tests: Recent Labs  Lab 08/05/20 1749 08/09/20 0401  AST 70* 31  ALT 41 45*  ALKPHOS 128* 102  BILITOT 0.5 0.1*  PROT 8.6* 6.7  ALBUMIN 3.8 2.8*    CBG: No results for input(s): GLUCAP in the last 168 hours.   Recent Results (from the past 240 hour(s))  SARS CORONAVIRUS 2 (TAT 6-24 HRS) Nasopharyngeal Nasopharyngeal Swab     Status: None   Collection Time: 08/06/20 12:30 AM   Specimen: Nasopharyngeal Swab  Result Value Ref Range Status   SARS Coronavirus 2 NEGATIVE NEGATIVE Final    Comment: (NOTE) SARS-CoV-2 target nucleic acids are NOT DETECTED.  The SARS-CoV-2 RNA is generally detectable in upper and lower respiratory specimens during the acute phase of infection. Negative results do not preclude SARS-CoV-2 infection, do not rule out co-infections with other pathogens, and should not be used as the sole basis for treatment or other patient management decisions. Negative results must be combined with clinical observations, patient history, and epidemiological information. The expected result is Negative.  Fact Sheet for Patients: HairSlick.nohttps://www.fda.gov/media/138098/download  Fact Sheet for Healthcare Providers: quierodirigir.comhttps://www.fda.gov/media/138095/download  This test is not yet approved or cleared by the [place] FDA and  has been authorized for detection and/or diagnosis of SARS-CoV-2 by FDA under an Emergency Use Authorization (EUA). This EUA will remain  in effect (meaning this test can be used) for the duration of the COVID-19 declaration under Se ction 564(b)(1) of the Act, 21 U.S.C. section 360bbb-3(b)(1), unless the authorization is terminated or revoked sooner.  Performed at [facility], [address] [place], [place] [address]   Culture, Urine     Status: Abnormal   Collection Time: 08/06/20  6:04 PM   Specimen: Urine, Random  Result Value Ref Range Status   Specimen  Description   Final    URINE, RANDOM Performed at [facility], [address] [place], [place] [address]    Special Requests   Final    NONE Performed at [facility], [address] [place], [place] [address]    Culture >=100,000 COLONIES/mL ESCHERICHIA COLI (A)  Final   Report Status 08/09/2020 FINAL  Final   Organism ID, Bacteria ESCHERICHIA COLI (A)  Final      Susceptibility   Escherichia coli - MIC*    AMPICILLIN <=2 SENSITIVE Sensitive     CEFAZOLIN <=4 SENSITIVE Sensitive     CEFEPIME <=0.12 SENSITIVE Sensitive     CEFTRIAXONE <=0.25 SENSITIVE Sensitive     CIPROFLOXACIN <=0.25 SENSITIVE Sensitive     GENTAMICIN <=1 SENSITIVE Sensitive     IMIPENEM <=0.25 SENSITIVE Sensitive     NITROFURANTOIN <=16 SENSITIVE Sensitive     TRIMETH/SULFA <=20 SENSITIVE Sensitive     AMPICILLIN/SULBACTAM <=2 SENSITIVE Sensitive     PIP/TAZO <=4 SENSITIVE Sensitive     * >=100,000 COLONIES/mL ESCHERICHIA COLI  Culture, blood (routine x 2)     Status: None (Preliminary result)   Collection Time: 08/07/20 12:05 AM   Specimen: BLOOD  Result Value Ref Range Status   Specimen Description   Final    BLOOD LEFT ANTECUBITAL Performed at [facility], [address] [place], [place] [address]    Special Requests   Final    BOTTLES DRAWN AEROBIC AND ANAEROBIC Blood Culture adequate volume Performed at [facility], [address] [place], [place] [address]    Culture   Final    NO GROWTH 3 DAYS Performed at [facility], [address] [place], [place] [address]    Report Status PENDING  Incomplete  Culture, blood (routine x 2)  Status: None (Preliminary result)   Collection Time: 08/07/20 12:05 AM   Specimen: BLOOD  Result Value Ref Range Status   Specimen Description   Final    BLOOD BLOOD LEFT HAND Performed at [facility], [address], [place], [place] [address]    Special  Requests   Final    BOTTLES DRAWN AEROBIC AND ANAEROBIC Blood Culture adequate volume Performed at [facility], [address], [place], [place] [address]    Culture   Final    NO GROWTH 3 DAYS Performed at [facility], [address] [place], [place] [address]    Report Status PENDING  Incomplete  Respiratory (~20 pathogens) panel by PCR     Status: None   Collection Time: 08/07/20 10:48 AM   Specimen: Nasopharyngeal Swab; Respiratory  Result Value Ref Range Status   Adenovirus NOT DETECTED NOT DETECTED Final   Coronavirus 229E NOT DETECTED NOT DETECTED Final    Comment: (NOTE) The Coronavirus on the Respiratory Panel, DOES NOT test for the novel  Coronavirus (2019 nCoV)    Coronavirus HKU1 NOT DETECTED NOT DETECTED Final   Coronavirus NL63 NOT DETECTED NOT DETECTED Final   Coronavirus OC43 NOT DETECTED NOT DETECTED Final   Metapneumovirus NOT DETECTED NOT DETECTED Final   Rhinovirus / Enterovirus NOT DETECTED NOT DETECTED Final   Influenza A NOT DETECTED NOT DETECTED Final   Influenza B NOT DETECTED NOT DETECTED Final   Parainfluenza Virus 1 NOT DETECTED NOT DETECTED Final   Parainfluenza Virus 2 NOT DETECTED NOT DETECTED Final   Parainfluenza Virus 3 NOT DETECTED NOT DETECTED Final   Parainfluenza Virus 4 NOT DETECTED NOT DETECTED Final   Respiratory Syncytial Virus NOT DETECTED NOT DETECTED Final   Bordetella pertussis NOT DETECTED NOT DETECTED Final   Bordetella Parapertussis NOT DETECTED NOT DETECTED Final   Chlamydophila pneumoniae NOT DETECTED NOT DETECTED Final   Mycoplasma pneumoniae NOT DETECTED NOT DETECTED Final    Comment: Performed at [facility], [address] [place], [place] [address]         Radiology Studies: DG Abd 2 Views  Result Date: 08/09/2020 CLINICAL DATA:  Abdominal pain EXAM: ABDOMEN - 2 VIEW COMPARISON:  CT abdomen pelvis 08/05/2020 FINDINGS: No evidence of bowel obstruction. No radiopaque calculi overlying  the renal shadows. Calcified granuloma overlies the right lung base. Scoliotic curvature of the spine with prior lumbar fusion from L3-L5 and severe degenerative disc changes above the fusion from T12-L3. IMPRESSION: No evidence of bowel obstruction or other acute abnormality. Electronically Signed   By: Caprice Renshaw   On: 08/09/2020 13:48        Scheduled Meds: . azithromycin  500 mg Oral Daily  . dextromethorphan-guaiFENesin  1 tablet Oral BID  . ferrous sulfate  325 mg Oral BID WC  . gabapentin  600 mg Oral TID  . losartan  25 mg Oral Daily  . melatonin  5 mg Oral QPC supper  . pantoprazole  40 mg Oral BID  . [START ON 08/11/2020] predniSONE  50 mg Oral Q breakfast  . senna-docusate  2 tablet Oral Daily  . sucralfate  1 g Oral TID WC & HS   Continuous Infusions:   LOS: 3 days   Time spent: 35 mins Greater than 50% of this time was spent in counseling, explanation of diagnosis, planning of further management, and coordination of care.   Voice Recognition Reubin Milan dictation system was used to create this note,  attempts have been made to correct errors. Please contact the author with questions and/or clarifications.   Albertine Grates, MD PhD FACP [facility]  Available via Epic secure chat 7am-7pm for nonurgent issues Please page for urgent issues To page the attending provider between 7A-7P or the covering provider during after hours 7P-7A, please log into the web site [email or web address] and access using universal [place] password for that web site. If you do not have the password, please call the hospital operator.    08/10/2020, 8:45 PM

## 2020-08-11 LAB — BASIC METABOLIC PANEL
Anion gap: 9 (ref 5–15)
BUN: 17 mg/dL (ref 8–23)
CO2: 22 mmol/L (ref 22–32)
Calcium: 8.5 mg/dL — ABNORMAL LOW (ref 8.9–10.3)
Chloride: 107 mmol/L (ref 98–111)
Creatinine, Ser: 0.73 mg/dL (ref 0.44–1.00)
GFR, Estimated: >60 mL/min (ref >60)
Glucose, Bld: 141 mg/dL — ABNORMAL HIGH (ref 70–99)
Potassium: 3.3 mmol/L — ABNORMAL LOW (ref 3.5–5.1)
Sodium: 138 mmol/L (ref 135–145)

## 2020-08-11 LAB — IRON AND TIBC
Iron: 53 ug/dL (ref 28–170)
Saturation Ratios: 17 % (ref 10.4–31.8)
TIBC: 304 ug/dL (ref 250–450)
UIBC: 251 ug/dL

## 2020-08-11 LAB — CBC
HCT: 32.7 % — ABNORMAL LOW (ref 36.0–46.0)
Hemoglobin: 10.6 g/dL — ABNORMAL LOW (ref 12.0–15.0)
MCH: 29 pg (ref 26.0–34.0)
MCHC: 32.4 g/dL (ref 30.0–36.0)
MCV: 89.6 fL (ref 80.0–100.0)
Platelets: 523 10*3/uL — ABNORMAL HIGH (ref 150–400)
RBC: 3.65 MIL/uL — ABNORMAL LOW (ref 3.87–5.11)
RDW: 15.7 % — ABNORMAL HIGH (ref 11.5–15.5)
WBC: 12.9 10*3/uL — ABNORMAL HIGH (ref 4.0–10.5)
nRBC: 0 % (ref 0.0–0.2)

## 2020-08-11 LAB — FOLATE: Folate: 13.8 ng/mL (ref >5.9)

## 2020-08-11 LAB — RETICULOCYTES
Immature Retic Fract: 17.1 % — ABNORMAL HIGH (ref 2.3–15.9)
RBC.: 3.68 MIL/uL — ABNORMAL LOW (ref 3.87–5.11)
Retic Count, Absolute: 40.5 10*3/uL (ref 19.0–186.0)
Retic Ct Pct: 1.1 % (ref 0.4–3.1)

## 2020-08-11 LAB — VITAMIN B12: Vitamin B-12: 123 pg/mL — ABNORMAL LOW (ref 180–914)

## 2020-08-11 MED ORDER — VITAMIN B-12 1000 MCG PO TABS
1000.0000 ug | ORAL_TABLET | Freq: Every day | ORAL | Status: DC
  Administered 2020-08-11 – 2020-08-14 (×4): 1000 ug via ORAL
  Filled 2020-08-11 (×4): qty 1

## 2020-08-11 MED ORDER — GUAIFENESIN ER 600 MG PO TB12
600.0000 mg | ORAL_TABLET | Freq: Two times a day (BID) | ORAL | Status: DC
  Administered 2020-08-11 – 2020-08-14 (×7): 600 mg via ORAL
  Filled 2020-08-11 (×7): qty 1

## 2020-08-11 MED ORDER — IPRATROPIUM-ALBUTEROL 0.5-2.5 (3) MG/3ML IN SOLN
3.0000 mL | Freq: Three times a day (TID) | RESPIRATORY_TRACT | Status: DC
  Administered 2020-08-11 – 2020-08-14 (×8): 3 mL via RESPIRATORY_TRACT
  Filled 2020-08-11 (×8): qty 3

## 2020-08-11 NOTE — Progress Notes (Signed)
SATURATION QUALIFICATIONS: (This note is used to comply with regulatory documentation for home oxygen)  Patient Saturations on Room Air at Rest = 100%  Patient Saturations on Room Air while Ambulating = 99%  Patient Saturations on n/a Liters of oxygen while Ambulating = n/a%

## 2020-08-11 NOTE — Care Management Important Message (Signed)
Important Message  Patient Details IM Letter given to the Patient. Name: Anne Carrillo MRN: [id number] Date of Birth: 1955/10/13   Medicare Important Message Given:  Yes     [name] 08/11/2020, 10:36 AM

## 2020-08-11 NOTE — Progress Notes (Signed)
PROGRESS NOTE    Anne Carrillo  ZOX:[id number] DOB: 1955-04-12 DOA: 08/05/2020 PCP: Burton Apley, MD    Chief Complaint  Patient presents with   Abdominal Pain   Emesis    Brief Narrative:  Past medical history of esophageal stricture, COPD, IDA, GERD.  Presents with complaints of nausea vomiting followed by hematemesis and positive Hemoccult Also treated for COPD exacerbation  Subjective:  Still feeling weak, , continue to have congested cough, she is wheezing more today, she is on room air at rest, no fever she c/o right side stomach pain, no bm for two days, no vomiting Does not feel like she can go home today  Assessment & Plan:   Principal Problem:   Acute GI bleeding Active Problems:   Oropharyngeal dysphagia   COPD (chronic obstructive pulmonary disease) (HCC)   Lung nodule   Chronic pain   GI bleed   LA grade C esophagitis with esophageal stricture and hiatal hernia -Presented with hematemesis/abdominal pain, CT abdomen pelvis without contrast on presentation negative for acute changes -S/p EGD on 6/4  by Dr. Dulce Sellar -Received IV PPI twice a day, currently on Protonix oral twice a day, plan for total of 8 weeks then followed by Protonix 40 mg once a day until repeat endoscopy -Continue Carafate for 4 weeks -Repeat EGD in 2 to 29-months to document healing of esophagitis and possible need esophageal dilation -Follow-up with [facility] in 6 weeks  Acute blood loss anemia Hemoglobin dropped from 13-10, also have component of hemodilution as well Hemoglobin stable at 10 She did not require blood transfusion Reports stool is still dark anemia panel no iron deficiency, does show low b12 and slightly elevated retic count Currently on oral iron supplement, start B12 supplement  Elevated LFT CT abdomen pelvis without contrast no acute findings LFT improved  COPD exacerbation With leukocytosis/fever on 6/ 4 Chest x-ray on 6/6"Calcified granulomas on the right.  Slight scarring left base. No edema or airspace opacity" COVID screening negative Negative respiratory viral panel More wheezing today , continue to have congested cough , though no hypoxia , no chest pain , no fever  She reports her heart rate went up when she ambulated with PT in the hallway , I have yet to see a formal report regarding her home O2 eval result  Continue prednisone , Zithromax, Mucinex, scheduled DuoNeb  Smooth oval peripheral 2 cm nodule at the right lung base. This has a benign appearance but is nonspecific. This could be followed with repeat CT in 6 months to assess stability.   UTI Blood culture no growth Urine culture positive for pansensitive E. 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 Underlying infection or neoplasm could present in this manner. Reported history of fracture left humerus 5 years ago, she declined surgery at the time, she had intermittent left arm pain and limited range of motion at baseline States she is going to see orthopedic Dr. Ophelia Charter on June 29 of this month  Constipation: Start stool softener   FTT: , Physical therapy ordered, pending evaluation, will likely need home health PT   The patient's BMI is: Body mass index is 26.83 kg/m.Marland Kitchen     Unresulted Labs (From admission, onward)     Start     Ordered   08/12/20 0500  CBC with Differential/Platelet  Tomorrow morning,   R  08/11/20 1406   08/12/20 0500  Comprehensive metabolic panel  Tomorrow morning,   R        08/11/20 1406   08/12/20 0500  Magnesium  Tomorrow morning,   R        08/11/20 1406   08/12/20 0500  TSH  Tomorrow morning,   R        08/11/20 1406   08/12/20 0500  Hepatitis panel, acute  Tomorrow morning,   R        08/11/20 1423   08/12/20 0500  CK  Tomorrow morning,   R        08/11/20 1423              DVT prophylaxis: SCDs  Start: 08/05/20 2216   Code Status: Full Family Communication: Patient Disposition:   Status is: Inpatient  Dispo: The patient is from: Home              Anticipated d/c is to: Home, likely need home health, pending PT eval              Anticipated d/c date is: Likely 6/10 pending respiratory status                Consultants:  [facility] Procedures:  EGD  Antimicrobials:   Anti-infectives (From admission, onward)    Start     Dose/Rate Route Frequency Ordered Stop   08/08/20 1000  azithromycin (ZITHROMAX) tablet 500 mg        500 mg Oral Daily 08/07/20 1543     08/07/20 1630  cephALEXin (KEFLEX) capsule 500 mg  Status:  Discontinued        500 mg Oral Every 12 hours 08/07/20 1543 08/10/20 1303   08/07/20 0915  azithromycin (ZITHROMAX) 500 mg in sodium chloride 0.9 % 250 mL IVPB        500 mg 250 mL/hr over 60 Minutes Intravenous  Once 08/07/20 0823 08/07/20 1230           Objective: Vitals:   08/10/20 1341 08/10/20 2110 08/11/20 0454 08/11/20 1357  BP: (!) 159/89 (!) 141/74 137/77 134/81  Pulse: 85 89 80 94  Resp: 18 16 16    Temp: 97.6 F (36.4 C) 97.8 F (36.6 C) 98.5 F (36.9 C) 98.4 F (36.9 C)  TempSrc: Oral Oral    SpO2: 97% 97% 97% 98%  Weight:      Height:       No intake or output data in the 24 hours ending 08/11/20 1427  Filed Weights   08/05/20 1722 08/06/20 1535  Weight: 60.8 kg 64.4 kg    Examination:  General exam: Frail, chronic ill-appearing, calm, NAD Respiratory system: bilateral diffuse wheezing, no rales, no rhonchi . Respiratory effort normal. Cardiovascular system: S1 & S2 heard, RRR. No JVD, no murmur, No pedal edema. Gastrointestinal system: Abdomen is nondistended, soft and nontender.  Normal bowel sounds heard. Central nervous system: Alert and oriented. No focal neurological deficits. Extremities: Generalized weakness, no edema Skin: No rashes, lesions or ulcers Psychiatry: Judgement and insight appear normal. Mood &  affect appropriate.     Data Reviewed: I have personally reviewed following labs and imaging studies  CBC: Recent Labs  Lab 08/06/20 0400 08/07/20 0005 08/07/20 0726 08/09/20 0401 08/11/20 0327  WBC 14.4* 15.6* 13.2* 17.2* 12.9*  NEUTROABS  --   --  8.6*  --   --   HGB 10.6* 10.0* 10.5* 10.0* 10.6*  HCT 32.2* 30.8* 33.2* 31.5* 32.7*  MCV 89.2 89.3 91.7 91.3 89.6  PLT 349 323 293 378 523*    Basic Metabolic Panel: Recent Labs  Lab 08/05/20 1749 08/06/20 0400 08/07/20 0726 08/09/20 0401 08/11/20 0327  NA 134* 136 133* 136 138  K 3.7 3.0* 3.8 4.4 3.3*  CL 97* 103 102 106 107  CO2 25 23 23  21* 22  GLUCOSE 105* 92 88 257* 141*  BUN 13 11 11 14 17   CREATININE 0.71 0.84 0.90 0.66 0.73  CALCIUM 9.1 7.9* 8.5* 9.0 8.5*    GFR: Estimated Creatinine Clearance: 60.2 mL/min (by C-G formula based on SCr of 0.73 mg/dL).  Liver Function Tests: Recent Labs  Lab 08/05/20 1749 08/09/20 0401  AST 70* 31  ALT 41 45*  ALKPHOS 128* 102  BILITOT 0.5 0.1*  PROT 8.6* 6.7  ALBUMIN 3.8 2.8*    CBG: No results for input(s): GLUCAP in the last 168 hours.   Recent Results (from the past 240 hour(s))  SARS CORONAVIRUS 2 (TAT 6-24 HRS) Nasopharyngeal Nasopharyngeal Swab     Status: None   Collection Time: 08/06/20 12:30 AM   Specimen: Nasopharyngeal Swab  Result Value Ref Range Status   SARS Coronavirus 2 NEGATIVE NEGATIVE Final    Comment: (NOTE) SARS-CoV-2 target nucleic acids are NOT DETECTED.  The SARS-CoV-2 RNA is generally detectable in upper and lower respiratory specimens during the acute phase of infection. Negative results do not preclude SARS-CoV-2 infection, do not rule out co-infections with other pathogens, and should not be used as the sole basis for treatment or other patient management decisions. Negative results must be combined with clinical observations, patient history, and epidemiological information. The expected result is Negative.  Fact Sheet for  Patients: 10/09/20  Fact Sheet for Healthcare Providers: 10/06/20  This test is not yet approved or cleared by the [place] FDA and  has been authorized for detection and/or diagnosis of SARS-CoV-2 by FDA under an Emergency Use Authorization (EUA). This EUA will remain  in effect (meaning this test can be used) for the duration of the COVID-19 declaration under Se ction 564(b)(1) of the Act, 21 U.S.C. section 360bbb-3(b)(1), unless the authorization is terminated or revoked sooner.  Performed at [facility], [address], [place], [address] [place]   Culture, Urine     Status: Abnormal   Collection Time: 08/06/20  6:04 PM   Specimen: Urine, Random  Result Value Ref Range Status   Specimen Description   Final    URINE, RANDOM Performed at [facility], [address] [place], [place]    Special Requests   Final    NONE Performed at [facility], [address], [place], [place]    Culture >=100,000 COLONIES/mL ESCHERICHIA COLI (A)  Final   Report Status 08/09/2020 FINAL  Final   Organism ID, Bacteria ESCHERICHIA COLI (A)  Final      Susceptibility   Escherichia coli - MIC*    AMPICILLIN <=2 SENSITIVE Sensitive     CEFAZOLIN <=4 SENSITIVE Sensitive     CEFEPIME <=0.12 SENSITIVE Sensitive     CEFTRIAXONE <=0.25 SENSITIVE Sensitive     CIPROFLOXACIN <=0.25 SENSITIVE Sensitive     GENTAMICIN <=1 SENSITIVE Sensitive     IMIPENEM <=0.25 SENSITIVE Sensitive     NITROFURANTOIN <=16 SENSITIVE Sensitive     TRIMETH/SULFA <=20 SENSITIVE Sensitive     AMPICILLIN/SULBACTAM <=2 SENSITIVE Sensitive     PIP/TAZO <=4 SENSITIVE Sensitive     * >=  100,000 COLONIES/mL ESCHERICHIA COLI  Culture, blood (routine x 2)     Status: None (Preliminary result)   Collection Time: 08/07/20 12:05 AM   Specimen: BLOOD  Result Value Ref Range Status   Specimen  Description   Final    BLOOD LEFT ANTECUBITAL Performed at [facility], [address] [place], [place] [address]    Special Requests   Final    BOTTLES DRAWN AEROBIC AND ANAEROBIC Blood Culture adequate volume Performed at [facility], [address] [place], [place] [address]    Culture   Final    NO GROWTH 4 DAYS Performed at [facility], [address] [place], [place] [address]    Report Status PENDING  Incomplete  Culture, blood (routine x 2)     Status: None (Preliminary result)   Collection Time: 08/07/20 12:05 AM   Specimen: BLOOD  Result Value Ref Range Status   Specimen Description   Final    BLOOD BLOOD LEFT HAND Performed at [facility], [address] [place], [place] [address]    Special Requests   Final    BOTTLES DRAWN AEROBIC AND ANAEROBIC Blood Culture adequate volume Performed at [facility], [address] [place], [place] [address]    Culture   Final    NO GROWTH 4 DAYS Performed at [facility], [address] [place], [place] [address]    Report Status PENDING  Incomplete  Respiratory (~20 pathogens) panel by PCR     Status: None   Collection Time: 08/07/20 10:48 AM   Specimen: Nasopharyngeal Swab; Respiratory  Result Value Ref Range Status   Adenovirus NOT DETECTED NOT DETECTED Final   Coronavirus 229E NOT DETECTED NOT DETECTED Final    Comment: (NOTE) The Coronavirus on the Respiratory Panel, DOES NOT test for the novel  Coronavirus (2019 nCoV)    Coronavirus HKU1 NOT DETECTED NOT DETECTED Final   Coronavirus NL63 NOT DETECTED NOT DETECTED Final   Coronavirus OC43 NOT DETECTED NOT DETECTED Final   Metapneumovirus NOT DETECTED NOT DETECTED Final   Rhinovirus / Enterovirus NOT DETECTED NOT DETECTED Final   Influenza A NOT DETECTED NOT DETECTED Final   Influenza B NOT DETECTED NOT DETECTED Final   Parainfluenza Virus 1 NOT DETECTED NOT DETECTED Final    Parainfluenza Virus 2 NOT DETECTED NOT DETECTED Final   Parainfluenza Virus 3 NOT DETECTED NOT DETECTED Final   Parainfluenza Virus 4 NOT DETECTED NOT DETECTED Final   Respiratory Syncytial Virus NOT DETECTED NOT DETECTED Final   Bordetella pertussis NOT DETECTED NOT DETECTED Final   Bordetella Parapertussis NOT DETECTED NOT DETECTED Final   Chlamydophila pneumoniae NOT DETECTED NOT DETECTED Final   Mycoplasma pneumoniae NOT DETECTED NOT DETECTED Final    Comment: Performed at [facility], [address] [place], [place] [address]         Radiology Studies: No results found.       Scheduled Meds:  azithromycin  500 mg Oral Daily   ferrous sulfate  325 mg Oral BID WC   gabapentin  600 mg Oral TID   guaiFENesin  600 mg Oral BID   ipratropium-albuterol  3 mL Nebulization TID   losartan  25 mg Oral Daily   melatonin  5 mg Oral QPC supper   pantoprazole  40 mg Oral BID   predniSONE  50 mg Oral Q breakfast   senna-docusate  2 tablet Oral Daily  sucralfate  1 g Oral TID WC & HS   vitamin B-12  1,000 mcg Oral Daily   Continuous Infusions:   LOS: 4 days   Time spent: 35 mins Greater than 50% of this time was spent in counseling, explanation of diagnosis, planning of further management, and coordination of care.   Voice Recognition [employer]ubin [place] dictation system was used to create this note, attempts have been made to correct errors. Please contact the author with questions and/or clarifications.   Albertine Grates, MD PhD FACP [facility]  Available via Epic secure chat 7am-7pm for nonurgent issues Please page for urgent issues To page the attending provider between 7A-7P or the covering provider during after hours 7P-7A, please log into the web site [email or web address] and access using universal [place] password for that web site. If you do not have the password, please call the hospital operator.    08/11/2020, 2:27 PM

## 2020-08-11 NOTE — Progress Notes (Signed)
Physical Therapy Treatment Patient Details Name: Anne Carrillo MRN: [id number] DOB: 10/25/1955 Today's Date: 08/11/2020    History of Present Illness Pt is 65 yo female who presented with nausea/vomitting/hematemesis on 08/05/20.  Pt admitted with upper GI bleed and anemia.  GI consulted performed EGD 08/06/20 and pt with severe esophagitis.  Also with COPD exacerbation. Pt with medical hx of esophageal stricture, COPD, IDA, and GERD, and R tibia osteotomy 10/14/19    PT Comments    Pt AxO x 3 very motivated.  "I hurt but I know it's good for me" Assisted OOB to bathroom.  All at Supervision level pt was able to transfer self, don/doff underpants at a safe standing balance and perform self hygiene.  "Let me sit a minute".  Pt does fatigue quickly.  Then assisted with amb in hallway.  General Gait Details: pt declined RW but did use wall rails and furniture to steady self 85% of time.  Poor forward flex posture and shuffled sluggish steps.  "I'm good, this is how I walk".  Limited distance due to dyspnea but pt admits she is a smoker. Pt plans to return home.    Follow Up Recommendations  Home health PT;Supervision - Intermittent     Equipment Recommendations  None recommended by PT    Recommendations for Other Services       Precautions / Restrictions Precautions Precautions: None Restrictions Weight Bearing Restrictions: No    Mobility  Bed Mobility Overal bed mobility: Needs Assistance Bed Mobility: Supine to Sit;Sit to Supine     Supine to sit: Supervision Sit to supine: Supervision   General bed mobility comments: pt self able    Transfers Overall transfer level: Needs assistance Equipment used: Rolling walker (2 wheeled) Transfers: Sit to/from Stand Sit to Stand: Supervision         General transfer comment: good safety cognition and use of hands to steady self  Ambulation/Gait Ambulation/Gait assistance: Min guard;Supervision Gait Distance (Feet): 110  Feet Assistive device: None Gait Pattern/deviations: Step-through pattern;Decreased stride length Gait velocity: decreased   General Gait Details: pt declined RW but did use wall rails and furniture to steady self 85% of time.  Poor forward flex posture and shuffled sluggish steps.  "I'm good, this is how I walk".  Limited distance due to dyspnea but pt admits she is a smoker.   Stairs             Wheelchair Mobility    Modified Rankin (Stroke Patients Only)       Balance                                            Cognition Arousal/Alertness: Awake/alert Behavior During Therapy: WFL for tasks assessed/performed Overall Cognitive Status: Within Functional Limits for tasks assessed                                 General Comments: AxO very motivated "I hurt but I know it's good for me"      Exercises      General Comments        Pertinent Vitals/Pain Pain Assessment: Faces Faces Pain Scale: Hurts a little bit Pain Location: stomach Pain Descriptors / Indicators: Discomfort Pain Intervention(s): Monitored during session    Home Living  Prior Function            PT Goals (current goals can now be found in the care plan section) Progress towards PT goals: Progressing toward goals    Frequency    Min 3X/week      PT Plan Current plan remains appropriate    Co-evaluation              AM-PAC PT "6 Clicks" Mobility   Outcome Measure  Help needed turning from your back to your side while in a flat bed without using bedrails?: A Little Help needed moving from lying on your back to sitting on the side of a flat bed without using bedrails?: A Little Help needed moving to and from a bed to a chair (including a wheelchair)?: A Little Help needed standing up from a chair using your arms (e.g., wheelchair or bedside chair)?: A Little Help needed to walk in hospital room?: A Little Help  needed climbing 3-5 steps with a railing? : A Little 6 Click Score: 18    End of Session Equipment Utilized During Treatment: Gait belt Activity Tolerance: Patient tolerated treatment well Patient left: in bed;with call bell/phone within reach;with bed alarm set Nurse Communication: Mobility status PT Visit Diagnosis: Other abnormalities of gait and mobility (R26.89)     Time: 1240-1305 PT Time Calculation (min) (ACUTE ONLY): 25 min  Charges:  $Gait Training: 8-22 mins $Therapeutic Activity: 8-22 mins                     {Kaylor Simenson  PTA Acute  Rehabilitation Services Pager      [phone] Office      [phone]

## 2020-08-12 ENCOUNTER — Inpatient Hospital Stay (HOSPITAL_COMMUNITY): Payer: Medicare HMO

## 2020-08-12 LAB — MRSA PCR SCREENING: MRSA by PCR: NEGATIVE

## 2020-08-12 LAB — CULTURE, BLOOD (ROUTINE X 2)
Culture: NO GROWTH
Culture: NO GROWTH
Special Requests: ADEQUATE
Special Requests: ADEQUATE

## 2020-08-12 LAB — COMPREHENSIVE METABOLIC PANEL
ALT: 40 U/L (ref 0–44)
AST: 26 U/L (ref 15–41)
Albumin: 2.7 g/dL — ABNORMAL LOW (ref 3.5–5.0)
Alkaline Phosphatase: 76 U/L (ref 38–126)
Anion gap: 8 (ref 5–15)
BUN: 16 mg/dL (ref 8–23)
CO2: 23 mmol/L (ref 22–32)
Calcium: 8.6 mg/dL — ABNORMAL LOW (ref 8.9–10.3)
Chloride: 106 mmol/L (ref 98–111)
Creatinine, Ser: 0.58 mg/dL (ref 0.44–1.00)
GFR, Estimated: >60 mL/min (ref >60)
Glucose, Bld: 112 mg/dL — ABNORMAL HIGH (ref 70–99)
Potassium: 3.3 mmol/L — ABNORMAL LOW (ref 3.5–5.1)
Sodium: 137 mmol/L (ref 135–145)
Total Bilirubin: 0.4 mg/dL (ref 0.3–1.2)
Total Protein: 5.9 g/dL — ABNORMAL LOW (ref 6.5–8.1)

## 2020-08-12 LAB — CBC WITH DIFFERENTIAL/PLATELET
Abs Immature Granulocytes: 0.25 10*3/uL — ABNORMAL HIGH (ref 0.00–0.07)
Basophils Absolute: 0 10*3/uL (ref 0.0–0.1)
Basophils Relative: 0 %
Eosinophils Absolute: 0 10*3/uL (ref 0.0–0.5)
Eosinophils Relative: 0 %
HCT: 32.6 % — ABNORMAL LOW (ref 36.0–46.0)
Hemoglobin: 10.6 g/dL — ABNORMAL LOW (ref 12.0–15.0)
Immature Granulocytes: 2 %
Lymphocytes Relative: 39 %
Lymphs Abs: 5.1 10*3/uL — ABNORMAL HIGH (ref 0.7–4.0)
MCH: 29.2 pg (ref 26.0–34.0)
MCHC: 32.5 g/dL (ref 30.0–36.0)
MCV: 89.8 fL (ref 80.0–100.0)
Monocytes Absolute: 1.1 10*3/uL — ABNORMAL HIGH (ref 0.1–1.0)
Monocytes Relative: 8 %
Neutro Abs: 6.7 10*3/uL (ref 1.7–7.7)
Neutrophils Relative %: 51 %
Platelets: 520 10*3/uL — ABNORMAL HIGH (ref 150–400)
RBC: 3.63 MIL/uL — ABNORMAL LOW (ref 3.87–5.11)
RDW: 15.4 % (ref 11.5–15.5)
WBC: 13.2 10*3/uL — ABNORMAL HIGH (ref 4.0–10.5)
nRBC: 0 % (ref 0.0–0.2)

## 2020-08-12 LAB — MAGNESIUM: Magnesium: 1.6 mg/dL — ABNORMAL LOW (ref 1.7–2.4)

## 2020-08-12 LAB — TSH: TSH: 2.313 u[IU]/mL (ref 0.350–4.500)

## 2020-08-12 LAB — CK: Total CK: 6 U/L — ABNORMAL LOW (ref 38–234)

## 2020-08-12 LAB — HEPATITIS PANEL, ACUTE
HCV Ab: NONREACTIVE
Hep A IgM: NONREACTIVE
Hep B C IgM: NONREACTIVE
Hepatitis B Surface Ag: NONREACTIVE

## 2020-08-12 MED ORDER — POLYETHYLENE GLYCOL 3350 17 G PO PACK
17.0000 g | PACK | Freq: Every day | ORAL | Status: DC
  Administered 2020-08-12 – 2020-08-13 (×2): 17 g via ORAL
  Filled 2020-08-12 (×2): qty 1

## 2020-08-12 MED ORDER — MAGNESIUM SULFATE 2 GM/50ML IV SOLN
2.0000 g | Freq: Once | INTRAVENOUS | Status: AC
  Administered 2020-08-12: 2 g via INTRAVENOUS
  Filled 2020-08-12: qty 50

## 2020-08-12 NOTE — Progress Notes (Signed)
PROGRESS NOTE    Anne Carrillo  OZH:[id number] DOB: [birth year] DOA: 08/05/2020 PCP: Burton Apleyoberts, Ronald, MD    Chief Complaint  Patient presents with   Abdominal Pain   Emesis    Brief Narrative:  Past medical history of esophageal stricture, COPD, IDA, GERD.  Presents with complaints of nausea vomiting followed by hematemesis and positive Hemoccult Also treated for COPD exacerbation  Subjective:  continue to have congested cough, wheezing , felt sob several times last night ,required breathing treatment, denies chest pain But think having acid reflux, no appetite, only had soup and a little bit bread putting, last night ,  had dry heave, but no vomiting, last vomited three days ago C/o mid stomach pain , no bm for two days, but passing gas  she is on room air at rest, no fever She  is feeling weaker than normal, heart rate went up when ambulating with PT, but no hypoxia Does not feel like she can go home today  Assessment & Plan:   Principal Problem:   Acute GI bleeding Active Problems:   Oropharyngeal dysphagia   COPD (chronic obstructive pulmonary disease) (HCC)   Lung nodule   Chronic pain   GI bleed   LA grade C esophagitis with esophageal stricture and hiatal hernia -Presented with hematemesis/abdominal pain, CT abdomen pelvis without contrast on presentation negative for acute changes -S/p EGD on 6/4  by Dr. Dulce Sellarutlaw -Received IV PPI twice a day, currently on Protonix oral twice a day, plan for total of 8 weeks then followed by Protonix 40 mg once a day until repeat endoscopy -Continue Carafate for 4 weeks -Repeat EGD in 2 to 83-months to document healing of esophagitis and possible need esophageal dilation -Follow-up with [facility] in 6 weeks  Acute blood loss anemia Hemoglobin dropped from 13-10, also have component of hemodilution as well She did not require blood transfusion Reports stool is still dark anemia panel no iron deficiency, does show low b12 and slightly  elevated retic count Currently on oral iron supplement, start B12 supplement Hemoglobin stable at 10  Elevated LFT CT abdomen pelvis without contrast no acute findings LFT normalized CK unremarkable  hepatitis panel negative  COPD exacerbation With leukocytosis/fever on 6/ 4 Chest x-ray on 6/6"Calcified granulomas on the right. Slight scarring left base. No edema or airspace opacity" COVID screening negative Negative respiratory viral panel  continue to have congested cough , though no hypoxia , no chest pain , no fever  Continue prednisone , Zithromax, Mucinex, scheduled DuoNeb Persistent symptom, will repeat cxr two view, check mrsa screening  Smooth oval peripheral 2 cm nodule at the right lung base. This has a benign appearance but is nonspecific. This could be followed with repeat CT in 6 months to assess stability. Follow-up with PCP  Hypokalemia/hypomagnesium -state was told to have potassium allergy, but can eat banana -replace iv mag -repeat level in am   UTI, did report dysuria and blood in urine initially, urinary symptom has resolved Blood culture no growth Urine culture positive for pansensitive E. 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 Underlying infection or neoplasm could present in this manner. Reported history of fracture left humerus 5 years ago, she declined surgery at the time, she had intermittent left arm pain and limited range of motion at baseline States she is going to see orthopedic Dr. Ophelia CharterYates on June 29 of  this month  Constipation: still no bm, add miralax, out of bed   FTT: , Physical therapy ordered, pending evaluation, need home health PT   The patient's BMI is: Body mass index is 26.83 kg/m.Marland Kitchen     Unresulted Labs (From admission, onward)     Start     Ordered   08/13/20 0500  CBC with Differential/Platelet   Tomorrow morning,   R        08/12/20 0847   08/13/20 0500  Basic metabolic panel  Tomorrow morning,   R        08/12/20 0847   08/13/20 0500  Magnesium  Tomorrow morning,   R        08/12/20 0847              DVT prophylaxis: SCDs Start: 08/05/20 2216   Code Status: Full Family Communication: Patient Disposition:   Status is: Inpatient  Dispo: The patient is from: Home              Anticipated d/c is to: Home with  home health  PT eval              Anticipated d/c date is: Likely 6/11 pending respiratory status                Consultants:  [facility] Procedures:  EGD  Antimicrobials:   Anti-infectives (From admission, onward)    Start     Dose/Rate Route Frequency Ordered Stop   08/08/20 1000  azithromycin (ZITHROMAX) tablet 500 mg        500 mg Oral Daily 08/07/20 1543     08/07/20 1630  cephALEXin (KEFLEX) capsule 500 mg  Status:  Discontinued        500 mg Oral Every 12 hours 08/07/20 1543 08/10/20 1303   08/07/20 0915  azithromycin (ZITHROMAX) 500 mg in sodium chloride 0.9 % 250 mL IVPB        500 mg 250 mL/hr over 60 Minutes Intravenous  Once 08/07/20 0823 08/07/20 1230           Objective: Vitals:   08/11/20 2043 08/11/20 2150 08/12/20 0555 08/12/20 1402  BP:  104/73 (!) 146/85 (!) 150/82  Pulse:  89 75 98  Resp:  16 17 (P) 17  Temp:  98.2 F (36.8 C) 97.8 F (36.6 C) 98.6 F (37 C)  TempSrc:  Oral Oral Oral  SpO2: 98% 97% 95% 97%  Weight:      Height:        Intake/Output Summary (Last 24 hours) at 08/12/2020 1601 Last data filed at 08/12/2020 1358 Gross per 24 hour  Intake 480 ml  Output --  Net 480 ml    Filed Weights   08/05/20 1722 08/06/20 1535  Weight: 60.8 kg 64.4 kg    Examination:  General exam: Frail, chronic ill-appearing, calm, NAD Respiratory system: bilateral diffuse wheezing, no rales, no rhonchi . Respiratory effort normal. Cardiovascular system: S1 & S2 heard, RRR. No JVD, no murmur, No pedal  edema. Gastrointestinal system: Abdomen is nondistended, soft and nontender.  Normal bowel sounds heard. Central nervous system: Alert and oriented. No focal neurological deficits. Extremities: Generalized weakness, no edema Skin: No rashes, lesions or ulcers Psychiatry: Judgement and insight appear normal. Mood & affect appropriate.     Data Reviewed: I have personally reviewed following labs and imaging studies  CBC: Recent Labs  Lab 08/07/20 0005 08/07/20 0726 08/09/20 0401 08/11/20 0327 08/12/20 0334  WBC 15.6* 13.2* 17.2* 12.9*  13.2*  NEUTROABS  --  8.6*  --   --  6.7  HGB 10.0* 10.5* 10.0* 10.6* 10.6*  HCT 30.8* 33.2* 31.5* 32.7* 32.6*  MCV 89.3 91.7 91.3 89.6 89.8  PLT 323 293 378 523* 520*    Basic Metabolic Panel: Recent Labs  Lab 08/06/20 0400 08/07/20 0726 08/09/20 0401 08/11/20 0327 08/12/20 0334  NA 136 133* 136 138 137  K 3.0* 3.8 4.4 3.3* 3.3*  CL 103 102 106 107 106  CO2 23 23 21* 22 23  GLUCOSE 92 88 257* 141* 112*  BUN 11 11 14 17 16   CREATININE 0.84 0.90 0.66 0.73 0.58  CALCIUM 7.9* 8.5* 9.0 8.5* 8.6*  MG  --   --   --   --  1.6*    GFR: Estimated Creatinine Clearance: 60.2 mL/min (by C-G formula based on SCr of 0.58 mg/dL).  Liver Function Tests: Recent Labs  Lab 08/05/20 1749 08/09/20 0401 08/12/20 0334  AST 70* 31 26  ALT 41 45* 40  ALKPHOS 128* 102 76  BILITOT 0.5 0.1* 0.4  PROT 8.6* 6.7 5.9*  ALBUMIN 3.8 2.8* 2.7*    CBG: No results for input(s): GLUCAP in the last 168 hours.   Recent Results (from the past 240 hour(s))  SARS CORONAVIRUS 2 (TAT 6-24 HRS) Nasopharyngeal Nasopharyngeal Swab     Status: None   Collection Time: 08/06/20 12:30 AM   Specimen: Nasopharyngeal Swab  Result Value Ref Range Status   SARS Coronavirus 2 NEGATIVE NEGATIVE Final    Comment: (NOTE) SARS-CoV-2 target nucleic acids are NOT DETECTED.  The SARS-CoV-2 RNA is generally detectable in upper and lower respiratory specimens during the acute  phase of infection. Negative results do not preclude SARS-CoV-2 infection, do not rule out co-infections with other pathogens, and should not be used as the sole basis for treatment or other patient management decisions. Negative results must be combined with clinical observations, patient history, and epidemiological information. The expected result is Negative.  Fact Sheet for Patients: 10/06/20  Fact Sheet for Healthcare Providers: HairSlick.no  This test is not yet approved or cleared by the quierodirigir.com FDA and  has been authorized for detection and/or diagnosis of SARS-CoV-2 by FDA under an Emergency Use Authorization (EUA). This EUA will remain  in effect (meaning this test can be used) for the duration of the COVID-19 declaration under Se ction 564(b)(1) of the Act, 21 U.S.C. section 360bbb-3(b)(1), unless the authorization is terminated or revoked sooner.  Performed at [facility], [address] [place], [place]   Culture, Urine     Status: Abnormal   Collection Time: 08/06/20  6:04 PM   Specimen: Urine, Random  Result Value Ref Range Status   Specimen Description   Final    URINE, RANDOM Performed at [facility], [address], [place], [place]    Special Requests   Final    NONE Performed at [facility], [address], [place], [place]    Culture >=100,000 COLONIES/mL ESCHERICHIA COLI (A)  Final   Report Status 08/09/2020 FINAL  Final   Organism ID, Bacteria ESCHERICHIA COLI (A)  Final      Susceptibility   Escherichia coli - MIC*    AMPICILLIN <=2 SENSITIVE Sensitive     CEFAZOLIN <=4 SENSITIVE Sensitive     CEFEPIME <=0.12 SENSITIVE Sensitive     CEFTRIAXONE <=0.25 SENSITIVE Sensitive     CIPROFLOXACIN <=0.25 SENSITIVE Sensitive  GENTAMICIN <=1 SENSITIVE Sensitive     IMIPENEM <=0.25 SENSITIVE Sensitive      NITROFURANTOIN <=16 SENSITIVE Sensitive     TRIMETH/SULFA <=20 SENSITIVE Sensitive     AMPICILLIN/SULBACTAM <=2 SENSITIVE Sensitive     PIP/TAZO <=4 SENSITIVE Sensitive     * >=100,000 COLONIES/mL ESCHERICHIA COLI  Culture, blood (routine x 2)     Status: None   Collection Time: 08/07/20 12:05 AM   Specimen: BLOOD  Result Value Ref Range Status   Specimen Description   Final    BLOOD LEFT ANTECUBITAL Performed at [facility], [address], [place], [place] [address]    Special Requests   Final    BOTTLES DRAWN AEROBIC AND ANAEROBIC Blood Culture adequate volume Performed at [facility], [address] [place], [place] [address]    Culture   Final    NO GROWTH 5 DAYS Performed at [facility], [address] [place], [place] [address]    Report Status 08/12/2020 FINAL  Final  Culture, blood (routine x 2)     Status: None   Collection Time: 08/07/20 12:05 AM   Specimen: BLOOD  Result Value Ref Range Status   Specimen Description   Final    BLOOD BLOOD LEFT HAND Performed at [facility], [address], [place], [place] [address]    Special Requests   Final    BOTTLES DRAWN AEROBIC AND ANAEROBIC Blood Culture adequate volume Performed at [facility], [address] [place], [place] [address]    Culture   Final    NO GROWTH 5 DAYS Performed at [facility], [address] [place], [place] [address]    Report Status 08/12/2020 FINAL  Final  Respiratory (~20 pathogens) panel by PCR     Status: None   Collection Time: 08/07/20 10:48 AM   Specimen: Nasopharyngeal Swab; Respiratory  Result Value Ref Range Status   Adenovirus NOT DETECTED NOT DETECTED Final   Coronavirus 229E NOT DETECTED NOT DETECTED Final    Comment: (NOTE) The Coronavirus on the Respiratory Panel, DOES NOT test for the novel  Coronavirus (2019 nCoV)    Coronavirus HKU1 NOT DETECTED NOT DETECTED Final    Coronavirus NL63 NOT DETECTED NOT DETECTED Final   Coronavirus OC43 NOT DETECTED NOT DETECTED Final   Metapneumovirus NOT DETECTED NOT DETECTED Final   Rhinovirus / Enterovirus NOT DETECTED NOT DETECTED Final   Influenza A NOT DETECTED NOT DETECTED Final   Influenza B NOT DETECTED NOT DETECTED Final   Parainfluenza Virus 1 NOT DETECTED NOT DETECTED Final   Parainfluenza Virus 2 NOT DETECTED NOT DETECTED Final   Parainfluenza Virus 3 NOT DETECTED NOT DETECTED Final   Parainfluenza Virus 4 NOT DETECTED NOT DETECTED Final   Respiratory Syncytial Virus NOT DETECTED NOT DETECTED Final   Bordetella pertussis NOT DETECTED NOT DETECTED Final   Bordetella Parapertussis NOT DETECTED NOT DETECTED Final   Chlamydophila pneumoniae NOT DETECTED NOT DETECTED Final   Mycoplasma pneumoniae NOT DETECTED NOT DETECTED Final    Comment: Performed at [facility], [address], [place], [place] [address]  MRSA PCR Screening     Status: None   Collection Time: 08/12/20 10:30 AM   Specimen: Nasal Mucosa; Nasopharyngeal  Result Value Ref Range Status   MRSA by PCR NEGATIVE NEGATIVE Final    Comment:        The GeneXpert MRSA Assay (FDA approved for NASAL specimens only), is  one component of a comprehensive MRSA colonization surveillance program. It is not intended to diagnose MRSA infection nor to guide or monitor treatment for MRSA infections. Performed at [facility], [address], [place], [place] [address]          Radiology Studies: DG Chest 2 View  Result Date: 08/12/2020 CLINICAL DATA:  Nausea and vomiting with hematemesis. EXAM: CHEST - 2 VIEW COMPARISON:  Chest x-ray dated August 08, 2020. FINDINGS: Normal heart size. Normal pulmonary vascularity. No focal consolidation, pleural effusion, or pneumothorax. Unchanged calcified granuloma at the peripheral right lung base. No acute osseous abnormality. Unchanged deformity and bone loss of the left proximal humerus.  IMPRESSION: 1. No acute cardiopulmonary disease. Electronically Signed   By: Obie Dredge M.D.   On: 08/12/2020 14:53         Scheduled Meds:  azithromycin  500 mg Oral Daily   ferrous sulfate  325 mg Oral BID WC   gabapentin  600 mg Oral TID   guaiFENesin  600 mg Oral BID   ipratropium-albuterol  3 mL Nebulization TID   losartan  25 mg Oral Daily   melatonin  5 mg Oral QPC supper   pantoprazole  40 mg Oral BID   polyethylene glycol  17 g Oral Daily   predniSONE  50 mg Oral Q breakfast   senna-docusate  2 tablet Oral Daily   sucralfate  1 g Oral TID WC & HS   vitamin B-12  1,000 mcg Oral Daily   Continuous Infusions:     LOS: 5 days   Time spent: 35 mins Greater than 50% of this time was spent in counseling, explanation of diagnosis, planning of further management, and coordination of care.   Voice Recognition Reubin Milan dictation system was used to create this note, attempts have been made to correct errors. Please contact the author with questions and/or clarifications.   Albertine Grates, MD PhD FACP [facility]  Available via Epic secure chat 7am-7pm for nonurgent issues Please page for urgent issues To page the attending provider between 7A-7P or the covering provider during after hours 7P-7A, please log into the web site [email or web address] and access using universal [place] password for that web site. If you do not have the password, please call the hospital operator.    08/12/2020, 4:01 PM

## 2020-08-13 ENCOUNTER — Inpatient Hospital Stay (HOSPITAL_COMMUNITY): Payer: Medicare HMO

## 2020-08-13 LAB — CBC WITH DIFFERENTIAL/PLATELET
Abs Immature Granulocytes: 0.39 10*3/uL — ABNORMAL HIGH (ref 0.00–0.07)
Basophils Absolute: 0 10*3/uL (ref 0.0–0.1)
Basophils Relative: 0 %
Eosinophils Absolute: 0 10*3/uL (ref 0.0–0.5)
Eosinophils Relative: 0 %
HCT: 31 % — ABNORMAL LOW (ref 36.0–46.0)
Hemoglobin: 10.1 g/dL — ABNORMAL LOW (ref 12.0–15.0)
Immature Granulocytes: 3 %
Lymphocytes Relative: 31 %
Lymphs Abs: 4.6 10*3/uL — ABNORMAL HIGH (ref 0.7–4.0)
MCH: 29.3 pg (ref 26.0–34.0)
MCHC: 32.6 g/dL (ref 30.0–36.0)
MCV: 89.9 fL (ref 80.0–100.0)
Monocytes Absolute: 1.2 10*3/uL — ABNORMAL HIGH (ref 0.1–1.0)
Monocytes Relative: 8 %
Neutro Abs: 8.5 10*3/uL — ABNORMAL HIGH (ref 1.7–7.7)
Neutrophils Relative %: 58 %
Platelets: 548 10*3/uL — ABNORMAL HIGH (ref 150–400)
RBC: 3.45 MIL/uL — ABNORMAL LOW (ref 3.87–5.11)
RDW: 15.7 % — ABNORMAL HIGH (ref 11.5–15.5)
WBC: 14.7 10*3/uL — ABNORMAL HIGH (ref 4.0–10.5)
nRBC: 0 % (ref 0.0–0.2)

## 2020-08-13 LAB — BASIC METABOLIC PANEL
Anion gap: 9 (ref 5–15)
BUN: 16 mg/dL (ref 8–23)
CO2: 24 mmol/L (ref 22–32)
Calcium: 8.8 mg/dL — ABNORMAL LOW (ref 8.9–10.3)
Chloride: 106 mmol/L (ref 98–111)
Creatinine, Ser: 0.62 mg/dL (ref 0.44–1.00)
GFR, Estimated: >60 mL/min (ref >60)
Glucose, Bld: 129 mg/dL — ABNORMAL HIGH (ref 70–99)
Potassium: 3.9 mmol/L (ref 3.5–5.1)
Sodium: 139 mmol/L (ref 135–145)

## 2020-08-13 LAB — MAGNESIUM: Magnesium: 1.9 mg/dL (ref 1.7–2.4)

## 2020-08-13 MED ORDER — FERROUS SULFATE 325 (65 FE) MG PO TABS
325.0000 mg | ORAL_TABLET | Freq: Every day | ORAL | Status: DC
Start: 2020-08-14 — End: 2020-08-14
  Administered 2020-08-14: 325 mg via ORAL
  Filled 2020-08-13: qty 1

## 2020-08-13 MED ORDER — BISACODYL 10 MG RE SUPP
10.0000 mg | Freq: Every day | RECTAL | Status: DC
  Administered 2020-08-13: 10 mg via RECTAL
  Filled 2020-08-13 (×2): qty 1

## 2020-08-13 MED ORDER — POLYETHYLENE GLYCOL 3350 17 G PO PACK
17.0000 g | PACK | Freq: Two times a day (BID) | ORAL | Status: DC
  Administered 2020-08-13: 17 g via ORAL
  Filled 2020-08-13 (×2): qty 1

## 2020-08-13 NOTE — Plan of Care (Signed)
  Problem: Education: Goal: Knowledge of General Education information will improve Description Including pain rating scale, medication(s)/side effects and non-pharmacologic comfort measures Outcome: Progressing   

## 2020-08-13 NOTE — Progress Notes (Signed)
PROGRESS NOTE    Anne Carrillo  RCV:[id number] DOB: 1956/02/19 DOA: 08/05/2020 PCP: Burton Apley, MD    Chief Complaint  Patient presents with   Abdominal Pain   Emesis    Brief Narrative:  Past medical history of esophageal stricture, COPD, IDA, GERD.  Presents with complaints of nausea vomiting followed by hematemesis and positive Hemoccult Also treated for COPD exacerbation  Subjective:   continue to have congested cough, wheezing , denies chest pain C/o ab pain, has a small hard stool ball this morning, reports still black color  she is on room air at rest, no fever Start to feel a little stronger, " I do not want to go home feeling like this"   Assessment & Plan:   Principal Problem:   Acute GI bleeding Active Problems:   Oropharyngeal dysphagia   COPD (chronic obstructive pulmonary disease) (HCC)   Lung nodule   Chronic pain   GI bleed   LA grade C esophagitis with esophageal stricture and hiatal hernia -Presented with hematemesis/abdominal pain, CT abdomen pelvis without contrast on presentation negative for acute changes -S/p EGD on 6/4  by Dr. Dulce Sellar -Received IV PPI twice a day, currently on Protonix oral twice a day, plan for total of 8 weeks then followed by Protonix 40 mg once a day until repeat endoscopy -Continue Carafate for 4 weeks -Repeat EGD in 2 to 61-months to document healing of esophagitis and possible need esophageal dilation -Follow-up with [facility] in 6 weeks  Acute blood loss anemia Hemoglobin dropped from 13-10, also have component of hemodilution as well She did not require blood transfusion Reports stool is still dark anemia panel no iron deficiency, does show low b12 and slightly elevated retic count Currently on oral iron supplement, start B12 supplement Hemoglobin stable at 10  Elevated LFT CT abdomen pelvis without contrast no acute findings LFT normalized CK unremarkable  hepatitis panel negative  COPD exacerbation With  leukocytosis/fever on 6/ 4 Chest x-ray on 6/6"Calcified granulomas on the right. Slight scarring left base. No edema or airspace opacity" COVID screening negative Negative respiratory viral panel  continue to have congested cough , though no hypoxia , no chest pain , no fever  Persistent symptom, cxr two view no acute findings, mrsa screening negative, Continue prednisone , Zithromax, Mucinex, scheduled DuoNeb  Smooth oval peripheral 2 cm nodule at the right lung base. This has a benign appearance but is nonspecific. This could be followed with repeat CT in 6 months to assess stability. Follow-up with PCP  Hypokalemia/hypomagnesium -state was told to have potassium allergy, but can eat banana -replace iv mag -repeat level in am   UTI, did report dysuria and blood in urine initially, urinary symptom has resolved Blood culture no growth Urine culture positive for pansensitive E. 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 Underlying infection or neoplasm could present in this manner. Reported history of fracture left humerus 5 years ago, she declined surgery at the time, she had intermittent left arm pain and limited range of motion at baseline States she is going to see orthopedic Dr. Ophelia Charter on June 29 of this month  Constipation: still no bm, add miralax, out of bed C/o mid ab pain, will get kub  today   FTT: , Physical therapy ordered, pending evaluation, need home health PT   The patient's BMI is: Body mass index is 26.83  kg/m..     Unresulted Labs (From admission, onward)    None         DVT prophylaxis: SCDs Start: 08/05/20 2216   Code Status: Full Family Communication: Patient Disposition:   Status is: Inpatient  Dispo: The patient is from: Home              Anticipated d/c is to: Home with  home health  PT eval               Anticipated d/c date is: Likely 6/12 pending respiratory status, ab pain, kub                Consultants:  [facility] Procedures:  EGD  Antimicrobials:   Anti-infectives (From admission, onward)    Start     Dose/Rate Route Frequency Ordered Stop   08/08/20 1000  azithromycin (ZITHROMAX) tablet 500 mg        500 mg Oral Daily 08/07/20 1543     08/07/20 1630  cephALEXin (KEFLEX) capsule 500 mg  Status:  Discontinued        500 mg Oral Every 12 hours 08/07/20 1543 08/10/20 1303   08/07/20 0915  azithromycin (ZITHROMAX) 500 mg in sodium chloride 0.9 % 250 mL IVPB        500 mg 250 mL/hr over 60 Minutes Intravenous  Once 08/07/20 0823 08/07/20 1230           Objective: Vitals:   08/12/20 0555 08/12/20 1402 08/12/20 2143 08/13/20 0525  BP: (!) 146/85 (!) 150/82 (!) 170/82 133/73  Pulse: 75 98 92 79  Resp: 17 (P) Temp: 97.8 F (36.6 C) 98.6 F (37 C) 98.4 F (36.9 C) 98.2 F (36.8 C)  TempSrc: Oral Oral Oral Oral  SpO2: 95% 97% 98% 98%  Weight:      Height:        Intake/Output Summary (Last 24 hours) at 08/13/2020 0845 Last data filed at 08/12/2020 1819 Gross per 24 hour  Intake 480 ml  Output --  Net 480 ml    Filed Weights   08/05/20 1722 08/06/20 1535  Weight: 60.8 kg 64.4 kg    Examination:  General exam: Frail, chronic ill-appearing, calm, NAD Respiratory system: bilateral diffuse wheezing, no rales, no rhonchi . Respiratory effort normal. Cardiovascular system: S1 & S2 heard, RRR. No JVD, no murmur, No pedal edema. Gastrointestinal system: Abdomen is nondistended, mild mid ab tenderness, still soft, no rebound, Normal bowel sounds heard. Central nervous system: Alert and oriented. No focal neurological deficits. Extremities: Generalized weakness, no edema Skin: No rashes, lesions or ulcers Psychiatry: Judgement and insight appear normal. Mood & affect appropriate.     Data Reviewed: I have personally reviewed following labs and imaging  studies  CBC: Recent Labs  Lab 08/07/20 0726 08/09/20 0401 08/11/20 0327 08/12/20 0334 08/13/20 0328  WBC 13.2* 17.2* 12.9* 13.2* 14.7*  NEUTROABS 8.6*  --   --  6.7 8.5*  HGB 10.5* 10.0* 10.6* 10.6* 10.1*  HCT 33.2* 31.5* 32.7* 32.6* 31.0*  MCV 91.7 91.3 89.6 89.8 89.9  PLT 293 378 523* 520* 548*    Basic Metabolic Panel: Recent Labs  Lab 08/07/20 0726 08/09/20 0401 08/11/20 0327 08/12/20 0334 08/13/20 0328  NA 133* 136 138 137 139  K 3.8 4.4 3.3* 3.3* 3.9  CL 102 106 107 106 106  CO2 23 21* GLUCOSE 88 257* 141* 112* 129*  BUN 16  CREATININE 0.90 0.66 0.73 0.58 0.62  CALCIUM 8.5* 9.0 8.5* 8.6* 8.8*  MG  --   --   --  1.6* 1.9    GFR: Estimated Creatinine Clearance: 60.2 mL/min (by C-G formula based on SCr of 0.62 mg/dL).  Liver Function Tests: Recent Labs  Lab 08/09/20 0401 08/12/20 0334  AST 31 26  ALT 45* 40  ALKPHOS 102 76  BILITOT 0.1* 0.4  PROT 6.7 5.9*  ALBUMIN 2.8* 2.7*    CBG: No results for input(s): GLUCAP in the last 168 hours.   Recent Results (from the past 240 hour(s))  SARS CORONAVIRUS 2 (TAT 6-24 HRS) Nasopharyngeal Nasopharyngeal Swab     Status: None   Collection Time: 08/06/20 12:30 AM   Specimen: Nasopharyngeal Swab  Result Value Ref Range Status   SARS Coronavirus 2 NEGATIVE NEGATIVE Final    Comment: (NOTE) SARS-CoV-2 target nucleic acids are NOT DETECTED.  The SARS-CoV-2 RNA is generally detectable in upper and lower respiratory specimens during the acute phase of infection. Negative results do not preclude SARS-CoV-2 infection, do not rule out co-infections with other pathogens, and should not be used as the sole basis for treatment or other patient management decisions. Negative results must be combined with clinical observations, patient history, and epidemiological information. The expected result is Negative.  Fact Sheet for Patients: HairSlick.no  Fact Sheet  for Healthcare Providers: quierodirigir.com  This test is not yet approved or cleared by the [place] FDA and  has been authorized for detection and/or diagnosis of SARS-CoV-2 by FDA under an Emergency Use Authorization (EUA). This EUA will remain  in effect (meaning this test can be used) for the duration of the COVID-19 declaration under Se ction 564(b)(1) of the Act, 21 U.S.C. section 360bbb-3(b)(1), unless the authorization is terminated or revoked sooner.  Performed at [facility], [address], [place], [place] [address]   Culture, Urine     Status: Abnormal   Collection Time: 08/06/20  6:04 PM   Specimen: Urine, Random  Result Value Ref Range Status   Specimen Description   Final    URINE, RANDOM Performed at [facility], [address], [place], [place] [address]    Special Requests   Final    NONE Performed at [facility], [address] [place], [place] [address]    Culture >=100,000 COLONIES/mL ESCHERICHIA COLI (A)  Final   Report Status 08/09/2020 FINAL  Final   Organism ID, Bacteria ESCHERICHIA COLI (A)  Final      Susceptibility   Escherichia coli - MIC*    AMPICILLIN <=2 SENSITIVE Sensitive     CEFAZOLIN <=4 SENSITIVE Sensitive     CEFEPIME <=0.12 SENSITIVE Sensitive     CEFTRIAXONE <=0.25 SENSITIVE Sensitive     CIPROFLOXACIN <=0.25 SENSITIVE Sensitive     GENTAMICIN <=1 SENSITIVE Sensitive     IMIPENEM <=0.25 SENSITIVE Sensitive     NITROFURANTOIN <=16 SENSITIVE Sensitive     TRIMETH/SULFA <=20 SENSITIVE Sensitive     AMPICILLIN/SULBACTAM <=2 SENSITIVE Sensitive     PIP/TAZO <=4 SENSITIVE Sensitive     * >=100,000 COLONIES/mL ESCHERICHIA COLI  Culture, blood (routine x 2)     Status: None   Collection Time: 08/07/20 12:05 AM   Specimen: BLOOD  Result Value Ref Range Status   Specimen Description   Final    BLOOD LEFT ANTECUBITAL Performed at [facility],  [address] [place], [place] [address]  Special Requests   Final    BOTTLES DRAWN AEROBIC AND ANAEROBIC Blood Culture adequate volume Performed at [facility], [address], [place], [place] [address]    Culture   Final    NO GROWTH 5 DAYS Performed at [facility], [address], [place], [place] [address]    Report Status 08/12/2020 FINAL  Final  Culture, blood (routine x 2)     Status: None   Collection Time: 08/07/20 12:05 AM   Specimen: BLOOD  Result Value Ref Range Status   Specimen Description   Final    BLOOD BLOOD LEFT HAND Performed at [facility], [address], [place], [place] [address]    Special Requests   Final    BOTTLES DRAWN AEROBIC AND ANAEROBIC Blood Culture adequate volume Performed at [facility], [address] [place], [place] [address]    Culture   Final    NO GROWTH 5 DAYS Performed at [facility], [address], [place], [place] [address]    Report Status 08/12/2020 FINAL  Final  Respiratory (~20 pathogens) panel by PCR     Status: None   Collection Time: 08/07/20 10:48 AM   Specimen: Nasopharyngeal Swab; Respiratory  Result Value Ref Range Status   Adenovirus NOT DETECTED NOT DETECTED Final   Coronavirus 229E NOT DETECTED NOT DETECTED Final    Comment: (NOTE) The Coronavirus on the Respiratory Panel, DOES NOT test for the novel  Coronavirus (2019 nCoV)    Coronavirus HKU1 NOT DETECTED NOT DETECTED Final   Coronavirus NL63 NOT DETECTED NOT DETECTED Final   Coronavirus OC43 NOT DETECTED NOT DETECTED Final   Metapneumovirus NOT DETECTED NOT DETECTED Final   Rhinovirus / Enterovirus NOT DETECTED NOT DETECTED Final   Influenza A NOT DETECTED NOT DETECTED Final   Influenza B NOT DETECTED NOT DETECTED Final   Parainfluenza Virus 1 NOT DETECTED NOT DETECTED Final   Parainfluenza Virus 2 NOT DETECTED NOT DETECTED Final   Parainfluenza Virus 3 NOT DETECTED NOT  DETECTED Final   Parainfluenza Virus 4 NOT DETECTED NOT DETECTED Final   Respiratory Syncytial Virus NOT DETECTED NOT DETECTED Final   Bordetella pertussis NOT DETECTED NOT DETECTED Final   Bordetella Parapertussis NOT DETECTED NOT DETECTED Final   Chlamydophila pneumoniae NOT DETECTED NOT DETECTED Final   Mycoplasma pneumoniae NOT DETECTED NOT DETECTED Final    Comment: Performed at [facility], [address] [place], [place] [address]  MRSA PCR Screening     Status: None   Collection Time: 08/12/20 10:30 AM   Specimen: Nasal Mucosa; Nasopharyngeal  Result Value Ref Range Status   MRSA by PCR NEGATIVE NEGATIVE Final    Comment:        The GeneXpert MRSA Assay (FDA approved for NASAL specimens only), is one component of a comprehensive MRSA colonization surveillance program. It is not intended to diagnose MRSA infection nor to guide or monitor treatment for MRSA infections. Performed at [facility], [address], [place], [place] [address]          Radiology Studies: DG Chest 2 View  Result Date: 08/12/2020 CLINICAL DATA:  Nausea and vomiting with hematemesis. EXAM: CHEST - 2 VIEW COMPARISON:  Chest x-ray dated August 08, 2020. FINDINGS: Normal heart size. Normal pulmonary vascularity. No focal consolidation, pleural effusion, or pneumothorax. Unchanged calcified granuloma at the peripheral right lung base. No acute osseous abnormality. Unchanged deformity and bone loss of  the left proximal humerus. IMPRESSION: 1. No acute cardiopulmonary disease. Electronically Signed   By: Obie DredgeWilliam T Derry M.D.   On: 08/12/2020 14:53         Scheduled Meds:  azithromycin  500 mg Oral Daily   bisacodyl  10 mg Rectal Daily   ferrous sulfate  325 mg Oral BID WC   gabapentin  600 mg Oral TID   guaiFENesin  600 mg Oral BID   ipratropium-albuterol  3 mL Nebulization TID   losartan  25 mg Oral Daily   melatonin  5 mg Oral QPC supper   pantoprazole  40 mg Oral  BID   polyethylene glycol  17 g Oral BID   predniSONE  50 mg Oral Q breakfast   senna-docusate  2 tablet Oral Daily   sucralfate  1 g Oral TID WC & HS   vitamin B-12  1,000 mcg Oral Daily   Continuous Infusions:     LOS: 6 days   Time spent: 25 mins Greater than 50% of this time was spent in counseling, explanation of diagnosis, planning of further management, and coordination of care.   Voice Recognition Reubin [place]/Dragon dictation system was used to create this note, attempts have been made to correct errors. Please contact the author with questions and/or clarifications.   Albertine GratesFang Audry Pecina, MD PhD FACP [facility]  Available via Epic secure chat 7am-7pm for nonurgent issues Please page for urgent issues To page the attending provider between 7A-7P or the covering provider during after hours 7P-7A, please log into the web site [email or web address] and access using universal [place] password for that web site. If you do not have the password, please call the hospital operator.    08/13/2020, 8:45 AM

## 2020-08-14 MED ORDER — GUAIFENESIN ER 600 MG PO TB12: 600.0000 mg | ORAL_TABLET | Freq: Two times a day (BID) | ORAL | Status: AC

## 2020-08-14 MED ORDER — PANTOPRAZOLE SODIUM 40 MG PO TBEC: 40.0000 mg | DELAYED_RELEASE_TABLET | Freq: Two times a day (BID) | ORAL | 0 refills | Status: AC

## 2020-08-14 MED ORDER — CYANOCOBALAMIN 1000 MCG PO TABS
1000.0000 ug | ORAL_TABLET | Freq: Every day | ORAL | 0 refills | Status: AC
Start: 2020-08-15 — End: ?

## 2020-08-14 MED ORDER — SUCRALFATE 1 GM/10ML PO SUSP: 1.0000 g | Freq: Three times a day (TID) | ORAL | 0 refills | Status: AC

## 2020-08-14 MED ORDER — PREDNISONE 10 MG PO TABS: ORAL_TABLET | ORAL | 0 refills | Status: AC

## 2020-08-14 MED ORDER — FERROUS SULFATE 325 (65 FE) MG PO TABS: 325.0000 mg | ORAL_TABLET | Freq: Every day | ORAL | 0 refills | Status: AC

## 2020-08-14 MED ORDER — POLYETHYLENE GLYCOL 3350 17 G PO PACK: 17.0000 g | PACK | Freq: Two times a day (BID) | ORAL | 0 refills | Status: AC

## 2020-08-14 NOTE — TOC Transition Note (Signed)
Transition of Care [facility]) - CM/SW Discharge Note  Patient Details  Name: Anne Carrillo MRN: [id number] Date of Birth: 07-10-1955  Transition of Care [facility]) CM/SW Contact:  Ewing Schlein, LCSW Phone Number: 08/14/2020, 10:30 AM  Clinical Narrative: Patient has been set up with HHPT through [facility]. CSW notified Denyse Amass with [facility] of discharge. Orders are placed. TOC signing off.  Final next level of care: Home w Home Health Services Barriers to Discharge: Barriers Resolved  Patient Goals and CMS Choice Patient states their goals for this hospitalization and ongoing recovery are:: To get better CMS Medicare.gov Compare Post Acute Care list provided to:: Patient Choice offered to / list presented to : Patient  Discharge Plan and Services         DME Arranged: N/A DME Agency: NA HH Arranged: PT HH Agency: [facility]  Readmission Risk Interventions No flowsheet data found.

## 2020-08-14 NOTE — Plan of Care (Signed)

## 2020-08-14 NOTE — Progress Notes (Signed)
The patient is alert and oriented and has been seen by her physician. The orders for discharge were written. Patient has no IV in. Went over discharge instructions with patient. Patient was eager to go home. She is being discharged via wheelchair with all of her belongings.

## 2020-08-14 NOTE — Discharge Summary (Signed)
Discharge Summary  Anne Carrillo RUE:[id number] DOB: 06/19/1955  PCP: Burton Apley, MD  Admit date: 08/05/2020 Discharge date: 08/14/2020  Time spent: , more than 50% time spent on coordination of care.   Recommendations for Outpatient Follow-up:  F/u with PCP within a week  for hospital discharge follow up, repeat cbc/bmp at follow up, PCP to repeat CT chest in 6 months to follow-up on 2 cm right lower lobe nodule Follow-up with [facility] in 6 weeks Follow-up with orthopedic Dr. Annell Greening on 6/29 Home health    Discharge Diagnoses:  Active Hospital Problems   Diagnosis Date Noted   Acute GI bleeding 08/05/2020   GI bleed 08/07/2020   COPD (chronic obstructive pulmonary disease) (HCC) 08/05/2020   Lung nodule 08/05/2020   Chronic pain 08/05/2020   Oropharyngeal dysphagia 10/27/2019    Resolved Hospital Problems  No resolved problems to display.    Discharge Condition: stable  Diet recommendation: heart healthy  Filed Weights   08/05/20 1722 08/06/20 1535  Weight: 60.8 kg 64.4 kg    History of present illness: (Per admitting provider Dr. Cyndia Bent) Chief Complaint: Persistent nausea and vomiting   HPI: Anne Carrillo is a 65 y.o. female with medical history significant for esophageal stricture, COPD,iron deficiency anemia, GERD and chronic pain who presents with concerns of persistent nausea and vomiting and hematemesis.   For the past week patient has been having nausea and vomiting.  Initially the vomitus was clear but for the past few days he has trended to dark red.  She is on iron supplementation and also has tried to take Pepto-Bismol without relief.  Also has diffuse upper abdominal pain.  No diarrhea.  No fever.  Denies NSAID use.  She has history of esophageal stricture with dilation about 7 to 8 years ago in [place].  Recently has been having dysphagia and trouble swallowing solids.  Her last endoscopy and colonoscopy were both in [place] about 7 to 8 years  ago.   ED Course: She was tachycardic and mildly hypertensive.  Leukocytosis of 15 K, hemoglobin 13.2, Hemoccult positive.  CMP otherwise unremarkable.   CT of the abdomen shows moderate size hiatal hernia but no other acute findings.  Hospital Course:  Principal Problem:   Acute GI bleeding Active Problems:   Oropharyngeal dysphagia   COPD (chronic obstructive pulmonary disease) (HCC)   Lung nodule   Chronic pain   GI bleed  LA grade C esophagitis with esophageal stricture and hiatal hernia -Presented with hematemesis/abdominal pain, CT abdomen pelvis without contrast on presentation negative for acute changes -S/p EGD on 6/4  by Dr. Dulce Sellar -Received IV PPI twice a day, currently on Protonix oral twice a day, plan for total of 8 weeks then followed by Protonix 40 mg once a day until repeat endoscopy -Continue Carafate for 4 weeks -Repeat EGD in 2 to 43-months to document healing of esophagitis and possible need esophageal dilation -Follow-up with [facility] in 6 weeks   Acute blood loss anemia Hemoglobin dropped from 13-10, also have component of hemodilution as well She did not require blood transfusion Reports stool is still dark anemia panel no iron deficiency, does show low b12 and slightly elevated retic count Currently on oral iron supplement, start B12 supplement Hemoglobin stable at 10   Elevated LFT CT abdomen pelvis without contrast no acute findings LFT normalized CK unremarkable hepatitis panel negative   COPD exacerbation With leukocytosis/fever on 6/ 4 Chest x-ray on 6/6"Calcified granulomas on the right. Slight scarring left  base. No edema or airspace opacity" COVID screening negative Negative respiratory viral panel  cxr two view no acute findings, mrsa screening negative, Continue prednisone , Zithromax, Mucinex, scheduled DuoNeb Symptom has improved, no wheezing on day of discharge, she is discharged on prednisone taper Follow-up with PCP   Smooth oval  peripheral 2 cm nodule at the right lung base. This has a benign appearance but is nonspecific. This could be followed with repeat CT in 6 months to assess stability. Follow-up with PCP   Hypokalemia/hypomagnesium -state was told to have potassium allergy, but can eat banana -replace iv mag -Improved -Follow-up with PCP     UTI, did report dysuria and blood in urine initially, urinary symptom has resolved Blood culture no growth Urine culture positive for pansensitive E. 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 Underlying infection or neoplasm could present in this manner. Reported history of fracture left humerus 5 years ago, she declined surgery at the time, she had intermittent left arm pain and limited range of motion at baseline States she is going to see orthopedic Dr. Ophelia CharterYates on June 29 of this month   Constipation: Abdominal pain resolved after having  BM , KUB no acute findings  FTT: , Physical therapy recommend home health PT     The patient's BMI is: Body mass index is 26.83 kg/m.Marland Kitchen.                  [facility]  [facility] Procedures:  EGD   Discharge Exam: BP (!) 148/79 (BP Location: Right Arm)   Pulse 74   Temp 98.4 F (36.9 C) (Oral)   Resp 16   Ht 5\' 1"  (1.549 m)   Wt 64.4 kg   SpO2 98%   BMI 26.83 kg/m   General: NAD Cardiovascular: RRR Respiratory: Clear to auscultation bilaterally  Discharge Instructions You were cared for by a hospitalist during your hospital stay. If you have any questions about your discharge medications or the care you received while you were in the hospital after you are discharged, you can call the unit and asked to speak with the hospitalist on call if the hospitalist that took care of you is not available. Once you are discharged, your primary care physician will handle any further medical issues.  Please note that NO REFILLS for any discharge medications will be authorized once you are discharged, as it is imperative that you return to your primary care physician (or establish a relationship with a primary care physician if you do not have one) for your aftercare needs so that they can reassess your need for medications and monitor your lab values.  Discharge Instructions     Diet general   Complete by: As directed    Soft diet   Increase activity slowly   Complete by: As directed       Allergies as of 08/14/2020       Reactions   Hydrocodone Other (See Comments)   Hydromorphone Hcl    Other reaction(s): itching   Potassium    Other reaction(s): hives        Medication List     STOP taking these medications    methocarbamol 500 MG tablet Commonly known as: Robaxin       TAKE these medications    albuterol 108 (90 Base) MCG/ACT inhaler Commonly known as: VENTOLIN HFA  Inhale 2 puffs into the lungs 4 (four) times daily.   cyanocobalamin 1000 MCG tablet Take 1 tablet (1,000 mcg total) by mouth daily. Start taking on: August 15, 2020   cyclobenzaprine 5 MG tablet Commonly known as: FLEXERIL TAKE 1 TABLET BY MOUTH THREE TIMES DAILY AS NEEDED FOR MUSCLE SPASMS FOR UP TO 10 DAYS   Ensure Active High Protein Liqd Take 237 mLs by mouth daily.   ferrous sulfate 325 (65 FE) MG tablet Take 1 tablet (325 mg total) by mouth daily with breakfast. What changed: when to take this   gabapentin 600 MG tablet Commonly known as: NEURONTIN Take 1 tablet (600 mg total) by mouth 3 (three) times daily.   guaiFENesin 600 MG 12 hr tablet Commonly known as: MUCINEX Take 1 tablet (600 mg total) by mouth 2 (two) times daily.   LORazepam 0.5 MG tablet Commonly known as: ATIVAN Take 0.5 mg by mouth 2 (two) times daily as needed.   losartan 25 MG tablet Commonly known as: COZAAR Take 25 mg by mouth daily.   melatonin 5 MG Tabs Take 5 mg by mouth daily. 1 hour after evening  meal   oxyCODONE-acetaminophen 5-325 MG tablet Commonly known as: Percocet Take 1 tablet by mouth every 6 (six) hours as needed for severe pain.   pantoprazole 40 MG tablet Commonly known as: PROTONIX Take 1 tablet (40 mg total) by mouth 2 (two) times daily. What changed: when to take this   polyethylene glycol 17 g packet Commonly known as: MIRALAX / GLYCOLAX Take 17 g by mouth 2 (two) times daily.   predniSONE 10 MG tablet Commonly known as: DELTASONE Label  & dispense according to the schedule below. 4 Pills PO on day one then, 3 Pills PO on day two, 2Pills PO on day three, 1Pills PO on day four,  then STOP.  Total of 10 tabs   sennosides-docusate sodium 8.6-50 MG tablet Commonly known as: SENOKOT-S Take 2 tablets by mouth in the morning and at bedtime.   sucralfate 1 GM/10ML suspension Commonly known as: CARAFATE Take 10 mLs (1 g total) by mouth 4 (four) times daily -  with meals and at bedtime.   traMADol 50 MG tablet Commonly known as: ULTRAM Take by mouth every 12 (twelve) hours as needed.       Allergies  Allergen Reactions   Hydrocodone Other (See Comments)   Hydromorphone Hcl     Other reaction(s): itching   Potassium     Other reaction(s): hives    Follow-up Information     Willis Modena, MD. Schedule an appointment as soon as possible for a visit in 6 week(s).   Specialty: Gastroenterology Why: Follow-up for esophagitis Contact information: [address] [place] [address] [phone]         Care, [facility] Follow up.   Specialty: Home Health Services Why: Frances Furbish will follow you at home with [facility] Please call Frances Furbish if you have any questions or concerns.  Contact information: [address] [place] [address] [phone]         Eldred Manges, MD Follow up on 08/31/2020.   Specialty: Orthopedic Surgery Why: f/u on left arm Contact information: [address] [place]  [address] [phone]         Burton Apley, MD Follow up in 1 week(s).   Specialty: Internal Medicine Why: hospital discharge follow up, repeat cbc/bmp at follow up pcp to repeat CT in 6 months  to follow up on 2cm  right lower lung nodule Contact information: [address] [place] [address] [phone]                  The results of significant diagnostics from this hospitalization (including imaging, microbiology, ancillary and laboratory) are listed below for reference.    Significant Diagnostic Studies: CT ABDOMEN PELVIS WO CONTRAST  Result Date: 08/05/2020 CLINICAL DATA:  Nausea, vomiting, abdominal pain EXAM: CT ABDOMEN AND PELVIS WITHOUT CONTRAST TECHNIQUE: Multidetector CT imaging of the abdomen and pelvis was performed following the standard protocol without IV contrast. COMPARISON:  None. FINDINGS: Lower chest: Moderate-sized hiatal hernia. Calcified granulomas in the right lower lobe. Calcified right hilar lymph nodes. Peripheral noncalcified right lower lobe nodule measures 2 cm on image 32. Hepatobiliary: No focal hepatic abnormality. Gallbladder unremarkable. Pancreas: No focal abnormality or ductal dilatation. Spleen: Calcifications throughout the spleen.  Normal size. Adrenals/Urinary Tract: No adrenal abnormality. No focal renal abnormality. No stones or hydronephrosis. Urinary bladder is unremarkable. Stomach/Bowel: Normal appendix. Stomach, large and small bowel grossly unremarkable. Vascular/Lymphatic: Aortoiliac atherosclerosis. No evidence of aneurysm or adenopathy. Reproductive: Prior hysterectomy.  No adnexal masses. Other: No free fluid or free air. Musculoskeletal: No acute bony abnormality. Postoperative and degenerative changes in the lumbar spine. IMPRESSION: No acute findings in the abdomen or pelvis. Moderate-sized hiatal hernia. Old granulomatous disease. Smooth oval peripheral 2 cm nodule at the right lung base. This has a benign appearance  but is nonspecific. This could be followed with repeat CT in 6 months to assess stability. Electronically Signed   By: Charlett Nose M.D.   On: 08/05/2020 20:49   DG Chest 2 View  Result Date: 08/12/2020 CLINICAL DATA:  Nausea and vomiting with hematemesis. EXAM: CHEST - 2 VIEW COMPARISON:  Chest x-ray dated August 08, 2020. FINDINGS: Normal heart size. Normal pulmonary vascularity. No focal consolidation, pleural effusion, or pneumothorax. Unchanged calcified granuloma at the peripheral right lung base. No acute osseous abnormality. Unchanged deformity and bone loss of the left proximal humerus. IMPRESSION: 1. No acute cardiopulmonary disease. Electronically Signed   By: Obie Dredge M.D.   On: 08/12/2020 14:53   DG Chest 2 View  Result Date: 08/08/2020 CLINICAL DATA:  Shortness of breath EXAM: CHEST - 2 VIEW COMPARISON:  None FINDINGS: There are calcified granulomas in the right lower lobe. There is slight scarring in the left base. No edema or airspace opacity. Heart size and pulmonary vascularity are normal. No adenopathy. There is aortic atherosclerosis. Small hiatal hernia. There is a destructive lesion in the proximal left humerus with advanced erosion of the left humeral head. IMPRESSION: Calcified granulomas on the right. Slight scarring left base. No edema or airspace opacity. Heart size normal. Aortic Atherosclerosis (ICD10-I70.0). Small hiatal hernia. Destructive lesion proximal left humerus with advanced erosion in this area. Underlying infection or neoplasm could present in this manner. Electronically Signed   By: Bretta Bang III M.D.   On: 08/08/2020 13:06   DG Abd 1 View  Result Date: 08/13/2020 CLINICAL DATA:  Abdominal pain. EXAM: ABDOMEN - 1 VIEW COMPARISON:  08/09/2020 FINDINGS: The abdominal bowel gas pattern appears stable and unremarkable. No findings for obstruction or perforation. Stable lumbar fusion hardware and severe lumbar scoliosis and degenerative disease. IMPRESSION:  Unremarkable abdominal bowel gas pattern. Electronically Signed   By: Rudie Meyer M.D.   On: 08/13/2020 14:34   DG Abd 2 Views  Result Date: 08/09/2020 CLINICAL DATA:  Abdominal pain EXAM: ABDOMEN -  2 VIEW COMPARISON:  CT abdomen pelvis 08/05/2020 FINDINGS: No evidence of bowel obstruction. No radiopaque calculi overlying the renal shadows. Calcified granuloma overlies the right lung base. Scoliotic curvature of the spine with prior lumbar fusion from L3-L5 and severe degenerative disc changes above the fusion from T12-L3. IMPRESSION: No evidence of bowel obstruction or other acute abnormality. Electronically Signed   By: Caprice Renshaw   On: 08/09/2020 13:48    Microbiology: Recent Results (from the past 240 hour(s))  SARS CORONAVIRUS 2 (TAT 6-24 HRS) Nasopharyngeal Nasopharyngeal Swab     Status: None   Collection Time: 08/06/20 12:30 AM   Specimen: Nasopharyngeal Swab  Result Value Ref Range Status   SARS Coronavirus 2 NEGATIVE NEGATIVE Final    Comment: (NOTE) SARS-CoV-2 target nucleic acids are NOT DETECTED.  The SARS-CoV-2 RNA is generally detectable in upper and lower respiratory specimens during the acute phase of infection. Negative results do not preclude SARS-CoV-2 infection, do not rule out co-infections with other pathogens, and should not be used as the sole basis for treatment or other patient management decisions. Negative results must be combined with clinical observations, patient history, and epidemiological information. The expected result is Negative.  Fact Sheet for Patients: HairSlick.no  Fact Sheet for Healthcare Providers: quierodirigir.com  This test is not yet approved or cleared by the [place] FDA and  has been authorized for detection and/or diagnosis of SARS-CoV-2 by FDA under an Emergency Use Authorization (EUA). This EUA will remain  in effect (meaning this test can be used) for the duration of  the COVID-19 declaration under Se ction 564(b)(1) of the Act, 21 U.S.C. section 360bbb-3(b)(1), unless the authorization is terminated or revoked sooner.  Performed at [facility], [address], [place], [place] [address]   Culture, Urine     Status: Abnormal   Collection Time: 08/06/20  6:04 PM   Specimen: Urine, Random  Result Value Ref Range Status   Specimen Description   Final    URINE, RANDOM Performed at [facility], [address] [place], [place] [address]    Special Requests   Final    NONE Performed at [facility], [address] [place], [place] [address]    Culture >=100,000 COLONIES/mL ESCHERICHIA COLI (A)  Final   Report Status 08/09/2020 FINAL  Final   Organism ID, Bacteria ESCHERICHIA COLI (A)  Final      Susceptibility   Escherichia coli - MIC*    AMPICILLIN <=2 SENSITIVE Sensitive     CEFAZOLIN <=4 SENSITIVE Sensitive     CEFEPIME <=0.12 SENSITIVE Sensitive     CEFTRIAXONE <=0.25 SENSITIVE Sensitive     CIPROFLOXACIN <=0.25 SENSITIVE Sensitive     GENTAMICIN <=1 SENSITIVE Sensitive     IMIPENEM <=0.25 SENSITIVE Sensitive     NITROFURANTOIN <=16 SENSITIVE Sensitive     TRIMETH/SULFA <=20 SENSITIVE Sensitive     AMPICILLIN/SULBACTAM <=2 SENSITIVE Sensitive     PIP/TAZO <=4 SENSITIVE Sensitive     * >=100,000 COLONIES/mL ESCHERICHIA COLI  Culture, blood (routine x 2)     Status: None   Collection Time: 08/07/20 12:05 AM   Specimen: BLOOD  Result Value Ref Range Status   Specimen Description   Final    BLOOD LEFT ANTECUBITAL Performed at [facility], [address] [place], [place] [address]    Special Requests   Final    BOTTLES DRAWN AEROBIC AND ANAEROBIC Blood Culture adequate volume Performed at [facility], [address] [place], [place] [address]    Culture   Final    NO GROWTH 5 DAYS Performed at [facility], [address] [place], [place]  [address]    Report Status 08/12/2020 FINAL  Final  Culture, blood (routine x 2)     Status: None   Collection Time: 08/07/20 12:05 AM   Specimen: BLOOD  Result Value Ref Range Status   Specimen Description   Final    BLOOD BLOOD LEFT HAND Performed at [facility], [address], [place], [place] [address]    Special Requests   Final    BOTTLES DRAWN AEROBIC AND ANAEROBIC Blood Culture adequate volume Performed at [facility], [address], [place], [place] [address]    Culture   Final    NO GROWTH 5 DAYS Performed at [facility], [address] [place], [place] [address]    Report Status 08/12/2020 FINAL  Final  Respiratory (~20 pathogens) panel by PCR     Status: None   Collection Time: 08/07/20 10:48 AM   Specimen: Nasopharyngeal Swab; Respiratory  Result Value Ref Range Status   Adenovirus NOT DETECTED NOT DETECTED Final   Coronavirus 229E NOT DETECTED NOT DETECTED Final    Comment: (NOTE) The Coronavirus on the Respiratory Panel, DOES NOT test for the novel  Coronavirus (2019 nCoV)    Coronavirus HKU1 NOT DETECTED NOT DETECTED Final   Coronavirus NL63 NOT DETECTED NOT DETECTED Final   Coronavirus OC43 NOT DETECTED NOT DETECTED Final   Metapneumovirus NOT DETECTED NOT DETECTED Final   Rhinovirus / Enterovirus NOT DETECTED NOT DETECTED Final   Influenza A NOT DETECTED NOT DETECTED Final   Influenza B NOT DETECTED NOT DETECTED Final   Parainfluenza Virus 1 NOT DETECTED NOT DETECTED Final   Parainfluenza Virus 2 NOT DETECTED NOT DETECTED Final   Parainfluenza Virus 3 NOT DETECTED NOT DETECTED Final   Parainfluenza Virus 4 NOT DETECTED NOT DETECTED Final   Respiratory Syncytial Virus NOT DETECTED NOT DETECTED Final   Bordetella pertussis NOT DETECTED NOT DETECTED Final   Bordetella Parapertussis NOT DETECTED NOT DETECTED Final   Chlamydophila pneumoniae NOT DETECTED NOT DETECTED Final   Mycoplasma pneumoniae NOT DETECTED NOT  DETECTED Final    Comment: Performed at [facility], [address] [place], [place] [address]  MRSA PCR Screening     Status: None   Collection Time: 08/12/20 10:30 AM   Specimen: Nasal Mucosa; Nasopharyngeal  Result Value Ref Range Status   MRSA by PCR NEGATIVE NEGATIVE Final    Comment:        The GeneXpert MRSA Assay (FDA approved for NASAL specimens only), is one component of a comprehensive MRSA colonization surveillance program. It is not intended to diagnose MRSA infection nor to guide or monitor treatment for MRSA infections. Performed at [facility], [address] [place], [place] [address]      Labs: Basic Metabolic Panel: Recent Labs  Lab 08/09/20 0401 08/11/20 0327 08/12/20 0334 08/13/20 0328  NA 136 138 137 139  K 4.4 3.3* 3.3* 3.9  CL 106 107 106 106  CO2 21* 22 23 24   GLUCOSE 257* 141* 112* 129*  BUN 14 17 16 16   CREATININE 0.66 0.73 0.58 0.62  CALCIUM 9.0 8.5* 8.6* 8.8*  MG  --   --  1.6* 1.9   Liver Function Tests: Recent Labs  Lab 08/09/20 0401 08/12/20 0334  AST 31 26  ALT 45* 40  ALKPHOS 102 76  BILITOT 0.1* 0.4  PROT 6.7 5.9*  ALBUMIN 2.8* 2.7*   No results for input(s): LIPASE, AMYLASE in the last 168 hours. No results for input(s): AMMONIA in the last 168 hours. CBC: Recent Labs  Lab 08/09/20 0401 08/11/20 0327 08/12/20 0334 08/13/20 0328  WBC 17.2* 12.9* 13.2* 14.7*  NEUTROABS  --   --  6.7 8.5*  HGB 10.0* 10.6* 10.6* 10.1*  HCT 31.5* 32.7* 32.6* 31.0*  MCV 91.3 89.6 89.8 89.9  PLT 378 523* 520* 548*   Cardiac Enzymes: Recent Labs  Lab 08/12/20 0334  CKTOTAL 6*   BNP: BNP (last 3 results) No results for input(s): BNP in the last 8760 hours.  ProBNP (last 3 results) No results for input(s): PROBNP in the last 8760 hours.  CBG: No results for input(s): GLUCAP in the last 168 hours.     Signed:  Albertine Grates MD, PhD, FACP  [facility] 08/14/2020, 10:32 AM

## 2020-08-31 ENCOUNTER — Other Ambulatory Visit: Payer: Self-pay

## 2020-08-31 ENCOUNTER — Ambulatory Visit (INDEPENDENT_AMBULATORY_CARE_PROVIDER_SITE_OTHER): Payer: Medicare HMO

## 2020-08-31 ENCOUNTER — Ambulatory Visit (INDEPENDENT_AMBULATORY_CARE_PROVIDER_SITE_OTHER): Payer: Medicare HMO | Admitting: Orthopaedic Surgery

## 2020-08-31 VITALS — Ht 61.0 in | Wt 141.0 lb

## 2020-08-31 DIAGNOSIS — G8929 Other chronic pain: Secondary | ICD-10-CM

## 2020-08-31 DIAGNOSIS — S82401P Unspecified fracture of shaft of right fibula, subsequent encounter for closed fracture with malunion: Secondary | ICD-10-CM

## 2020-08-31 DIAGNOSIS — S82201P Unspecified fracture of shaft of right tibia, subsequent encounter for closed fracture with malunion: Secondary | ICD-10-CM

## 2020-08-31 DIAGNOSIS — M545 Low back pain, unspecified: Secondary | ICD-10-CM

## 2020-08-31 NOTE — Progress Notes (Signed)
Office Visit Note   Patient: Anne Carrillo           Date of Birth: Oct 09, 1955           MRN: [id number] Visit Date: 08/31/2020              Requested by: Burton Apley, MD [address] [place],  [place] [address] PCP: Burton Apley, MD   Assessment & Plan: Visit Diagnoses:  1. Chronic bilateral low back pain, unspecified whether sciatica present   2. Tibia/fibula fracture, right, closed, with malunion, subsequent encounter     Plan: She is released from care we discussed weaning down her pain medication.  She understands that she has curvature above her previous fusion.  She is happy with the correction of her severe leg deformity.  Return as needed.  Follow-Up Instructions: No follow-ups on file.   Orders:  Orders Placed This Encounter  Procedures   XR Tibia/Fibula Right   XR Lumbar Spine 2-3 Views   No orders of the defined types were placed in this encounter.     Procedures: No procedures performed   Clinical Data: No additional findings.   Subjective: Chief Complaint  Patient presents with   Right Leg - Follow-up    10/14/2019 right tibia/fibula osteotomy takedown malunion, plating   Lower Back - Pain    HPI patient is 10 months post removal of old broken plate tibial fibular osteotomy takedown replating.  She is now healed with x-rays today show complete healing with straight leg.  She still on chronic pain medication and also lorazepam.  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  She is happy the results of her leg corrective osteotomy.  Review of Systems updated unchanged.    Objective: Vital Signs: Ht 5\' 1"  (1.549 m)   Wt 141 lb (64 kg)   BMI 26.64 kg/m   Physical Exam Constitutional:      Appearance: She is well-developed.  HENT:     Head: Normocephalic.     Right Ear: External ear normal.     Left Ear: External ear normal. There is  no impacted cerumen.  Eyes:     Pupils: Pupils are equal, round, and reactive to light.  Neck:     Thyroid: No thyromegaly.     Trachea: No tracheal deviation.  Cardiovascular:     Rate and Rhythm: Normal rate.  Pulmonary:     Effort: Pulmonary effort is normal.  Abdominal:     Palpations: Abdomen is soft.  Musculoskeletal:     Cervical back: No rigidity.  Skin:    General: Skin is warm and dry.  Neurological:     Mental Status: She is alert and oriented to person, place, and time.  Psychiatric:        Behavior: Behavior normal.    Ortho Exam tib incisions well-healed.  Good knee range of motion.  Incision looks good.  No angular deformity on AP or lateral exam.  Pulses intact.  Specialty Comments:  No specialty comments available.  Imaging: No results found.   PMFS History: Patient Active Problem List   Diagnosis Date Noted   GI bleed 08/07/2020   Acute GI bleeding 08/05/2020   COPD (chronic obstructive pulmonary disease) (HCC) 08/05/2020   Lung nodule 08/05/2020   Chronic pain 08/05/2020   Grief reaction 11/26/2019   Sleep difficulties 11/26/2019   Edema 11/26/2019  Microcytic hypochromic anemia 10/27/2019   GERD (gastroesophageal reflux disease) 10/27/2019   Oropharyngeal dysphagia 10/27/2019   History of colonic polyps 10/27/2019   Tibia/fibula fracture, right, closed, with malunion, subsequent encounter 10/14/2019   Mechanical breakdown of internal orthopedic device (HCC) 04/01/2019   Past Medical History:  Diagnosis Date   Anemia    Arthritis    Carpal tunnel syndrome    Bilateral   COPD (chronic obstructive pulmonary disease) (HCC)    GERD (gastroesophageal reflux disease)    History of hiatal hernia    Hypertension    Migraines    Pneumonia    Tibia/fibula fracture    Right    Family History  Problem Relation Age of Onset   Heart attack Mother     Past Surgical History:  Procedure Laterality Date   ABDOMINAL HYSTERECTOMY     BACK SURGERY      Screws and plates lower back   CARPAL TUNNEL RELEASE Right    COLONOSCOPY     ECTOPIC PREGNANCY SURGERY  1986   ESOPHAGOGASTRODUODENOSCOPY (EGD) WITH PROPOFOL N/A 08/06/2020   Procedure: ESOPHAGOGASTRODUODENOSCOPY (EGD) WITH PROPOFOL;  Surgeon: Willis Modena, MD;  Location: [facility];  Service: Endoscopy;  Laterality: N/A;   ORIF TIBIA & FIBULA FRACTURES Right    TIBIA OSTEOTOMY Right 10/14/2019   Procedure: right tibia fibula osteotomy, takedown malunion, replating;  Surgeon: Eldred Manges, MD;  Location: [facility];  Service: Orthopedics;  Laterality: Right;   UPPER GI ENDOSCOPY     Social History   Occupational History   Not on file  Tobacco Use   Smoking status: Every Day    Packs/day: 0.25    Years: 50.00    Pack years: 12.50    Types: Cigarettes   Smokeless tobacco: Never   Tobacco comments:    At times she smoked as much as 2-1/2-3 packs/day.  Vaping Use   Vaping Use: Never used  Substance and Sexual Activity   Alcohol use: Yes   Drug use: Never   Sexual activity: Not on file

## 2020-09-02 ENCOUNTER — Ambulatory Visit: Payer: Medicare HMO | Admitting: Orthopaedic Surgery

## 2021-07-04 ENCOUNTER — Ambulatory Visit: Payer: Medicare HMO | Admitting: Orthopaedic Surgery

## 2021-07-04 ENCOUNTER — Ambulatory Visit: Payer: Self-pay

## 2021-07-04 ENCOUNTER — Ambulatory Visit (INDEPENDENT_AMBULATORY_CARE_PROVIDER_SITE_OTHER): Payer: Medicare HMO

## 2021-07-04 ENCOUNTER — Encounter: Payer: Self-pay | Admitting: Orthopaedic Surgery

## 2021-07-04 VITALS — BP 114/74 | HR 79 | Ht 59.0 in | Wt 157.0 lb

## 2021-07-04 DIAGNOSIS — M25561 Pain in right knee: Secondary | ICD-10-CM | POA: Diagnosis not present

## 2021-07-04 NOTE — Progress Notes (Signed)
? ?Office Visit Note ?  ?Patient: Anne Carrillo           ?Date of Birth: 1955-04-12           ?MRN: [id number] ?Visit Date: 07/04/2021 ?             ?Requested by: Burton Apley, MD ?[address] ?[place],  [place] [address] ?PCP: Burton Apley, MD ? ? ?Assessment & Plan: ?Visit Diagnoses:  ?1. Acute pain of right knee   ? ? ?Plan: Knee contusion from falling in bathtub directly hitting the area over the proximal plate.  Swelling is decreasing she is got improvement over the last 2 days.  X-rays are reviewed she is reassured nothing is changed she still in good position good alignment and office follow-up as needed.  She understands concerning her back that she would require multilevel fusion to incorporate this to the lower thoracic region at this point she wants to leave it as is.  Walking program recommended.  She can use Tylenol for pain. ? ?Follow-Up Instructions: No follow-ups on file.  ? ?Orders:  ?Orders Placed This Encounter  ?Procedures  ? XR Tibia/Fibula Right  ? XR Knee 1-2 Views Right  ? ?No orders of the defined types were placed in this encounter. ? ? ? ? Procedures: ?No procedures performed ? ? ?Clinical Data: ?No additional findings. ? ? ?Subjective: ?Chief Complaint  ?Patient presents with  ? Right Knee - Pain  ? ? ?HPI 66 year old female fell slipped in the tub 2 weeks ago with contusion directly over the lateral tibial plateau plate that was used for takedown of the malunion in August 2021.  She has had swelling ecchymosis is resolved.  She is to be on oxycodone or OxyContin and states now she takes some Flexeril.  She has problems with her back at previous two-level instrumented fusion done out of state for she moved down to [place].  Fused L4-S1 and has scoliosis degeneration with significant curve above her fusion.  No neurogenic claudication symptoms currently.  She is concerned she might of done something messed up her tibia. ? ?Review of Systems updated  unchanged ? ? ?Objective: ?Vital Signs: BP 114/74   Pulse 79   Ht 4\' 11"  (1.499 m)   Wt 157 lb (71.2 kg)   BMI 31.71 kg/m?  ? ?Physical Exam ?Constitutional:   ?   Appearance: She is well-developed.  ?HENT:  ?   Head: Normocephalic.  ?   Right Ear: External ear normal.  ?   Left Ear: External ear normal. There is no impacted cerumen.  ?Eyes:  ?   Pupils: Pupils are equal, round, and reactive to light.  ?Neck:  ?   Thyroid: No thyromegaly.  ?   Trachea: No tracheal deviation.  ?Cardiovascular:  ?   Rate and Rhythm: Normal rate.  ?Pulmonary:  ?   Effort: Pulmonary effort is normal.  ?Abdominal:  ?   Palpations: Abdomen is soft.  ?Musculoskeletal:  ?   Cervical back: No rigidity.  ?Skin: ?   General: Skin is warm and dry.  ?Neurological:  ?   Mental Status: She is alert and oriented to person, place, and time.  ?Psychiatric:     ?   Behavior: Behavior normal.  ? ? ?Ortho Exam some soft tissue swelling no ecchymosis directly over the proximal tibial plateau plate.  No knee hemarthrosis or effusion noted.  Mild prominence distal aspect of incision with subcutaneous edema. ? ?Specialty Comments:  ?No specialty  comments available. ? ?Imaging: ?XR Tibia/Fibula Right ? ?Result Date: 07/04/2021 ?AP lateral right tib-fib x-rays are obtained and reviewed this shows unchanged position after healed replating for malunion.  Tibia straight on both images in good alignment without evidence of new injury.  Soft tissue swelling over the proximal portion of the plate. Impression: Previous tibial plating right for malunion remains satisfactory position no significant knee arthritis.  ? ? ?PMFS History: ?Patient Active Problem List  ? Diagnosis Date Noted  ? GI bleed 08/07/2020  ? Acute GI bleeding 08/05/2020  ? COPD (chronic obstructive pulmonary disease) (HCC) 08/05/2020  ? Lung nodule 08/05/2020  ? Chronic pain 08/05/2020  ? Grief reaction 11/26/2019  ? Sleep difficulties 11/26/2019  ? Edema 11/26/2019  ? Microcytic hypochromic  anemia 10/27/2019  ? GERD (gastroesophageal reflux disease) 10/27/2019  ? Oropharyngeal dysphagia 10/27/2019  ? History of colonic polyps 10/27/2019  ? Tibia/fibula fracture, right, closed, with malunion, subsequent encounter 10/14/2019  ? Mechanical breakdown of internal orthopedic device (HCC) 04/01/2019  ? ?Past Medical History:  ?Diagnosis Date  ? Anemia   ? Arthritis   ? Carpal tunnel syndrome   ? Bilateral  ? COPD (chronic obstructive pulmonary disease) (HCC)   ? GERD (gastroesophageal reflux disease)   ? History of hiatal hernia   ? Hypertension   ? Migraines   ? Pneumonia   ? Tibia/fibula fracture   ? Right  ?  ?Family History  ?Problem Relation Age of Onset  ? Heart attack Mother   ?  ?Past Surgical History:  ?Procedure Laterality Date  ? ABDOMINAL HYSTERECTOMY    ? BACK SURGERY    ? Screws and plates lower back  ? CARPAL TUNNEL RELEASE Right   ? COLONOSCOPY    ? ECTOPIC PREGNANCY SURGERY  1986  ? ESOPHAGOGASTRODUODENOSCOPY (EGD) WITH PROPOFOL N/A 08/06/2020  ? Procedure: ESOPHAGOGASTRODUODENOSCOPY (EGD) WITH PROPOFOL;  Surgeon: Willis Modena, MD;  Location: [facility];  Service: Endoscopy;  Laterality: N/A;  ? ORIF TIBIA & FIBULA FRACTURES Right   ? TIBIA OSTEOTOMY Right 10/14/2019  ? Procedure: right tibia fibula osteotomy, takedown malunion, replating;  Surgeon: Eldred Manges, MD;  Location: [facility];  Service: Orthopedics;  Laterality: Right;  ? UPPER GI ENDOSCOPY    ? ?Social History  ? ?Occupational History  ? Not on file  ?Tobacco Use  ? Smoking status: Every Day  ?  Packs/day: 0.25  ?  Years: 50.00  ?  Pack years: 12.50  ?  Types: Cigarettes  ? Smokeless tobacco: Never  ? Tobacco comments:  ?  At times she smoked as much as 2-1/2-3 packs/day.  ?Vaping Use  ? Vaping Use: Never used  ?Substance and Sexual Activity  ? Alcohol use: Yes  ? Drug use: Never  ? Sexual activity: Not on file  ? ? ? ? ? ? ?

## 2021-08-20 IMAGING — CT CT ABD-PELV W/O CM
2 of 4 series · 16 of 46 positions shown, 18 images · non-contrast
Comparison: None.

CLINICAL DATA: Nausea, vomiting, abdominal pain

EXAM:
CT ABDOMEN AND PELVIS WITHOUT CONTRAST
TECHNIQUE: Multidetector CT imaging of the abdomen and pelvis was performed
following the standard protocol without IV contrast.

[Series 2: axial st · axial · 0.86mm/px · z∈[-401,-26]mm · 13 of 85 slices shown, 15 images]
[im 5/85  soft-tissue]
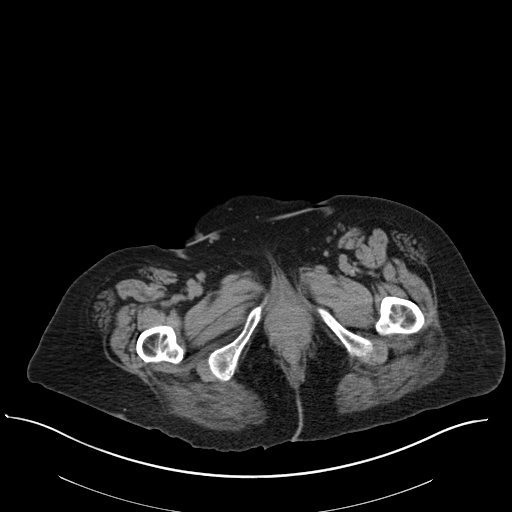
[im 5/85  bone]
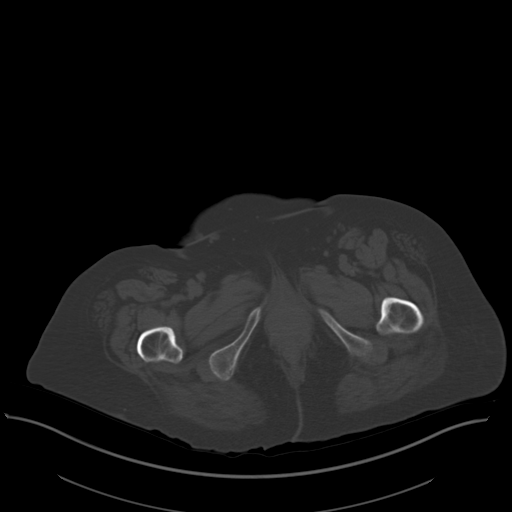
[im 10/85  soft-tissue]
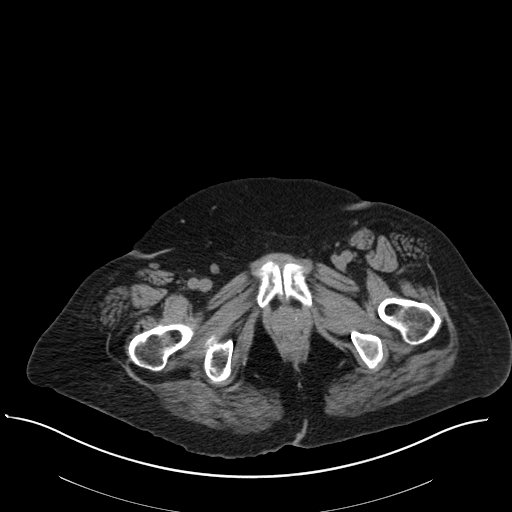
[im 20/85  soft-tissue]
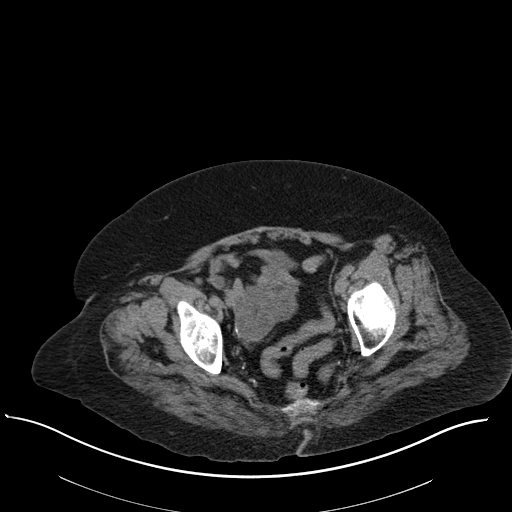
[im 25/85  soft-tissue]
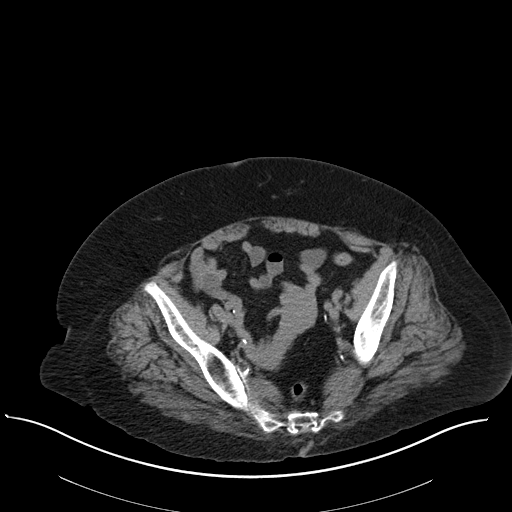
[im 30/85  soft-tissue]
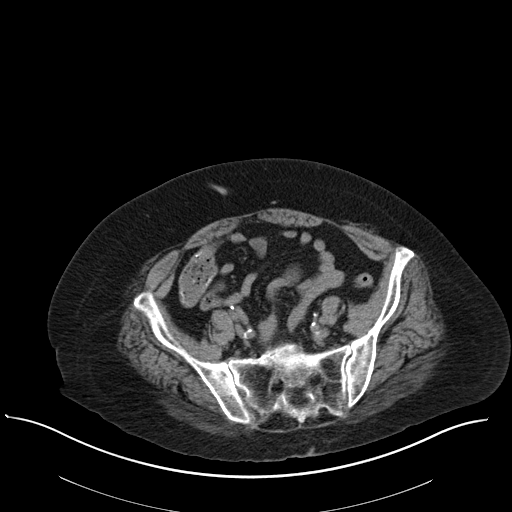
[im 35/85  soft-tissue]
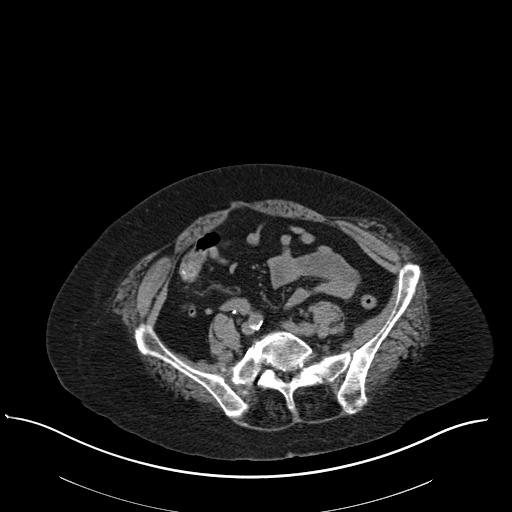
[im 45/85  soft-tissue]
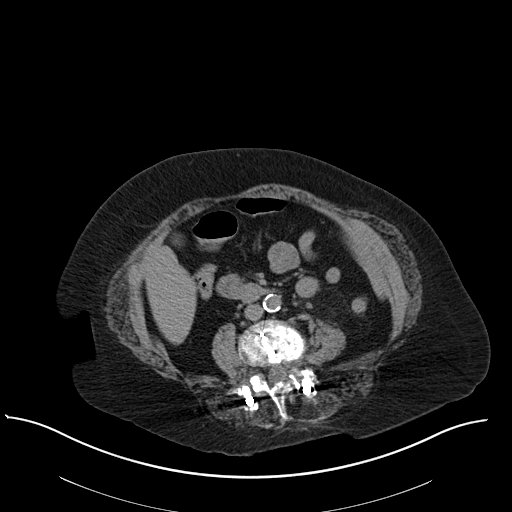
[im 50/85  soft-tissue]
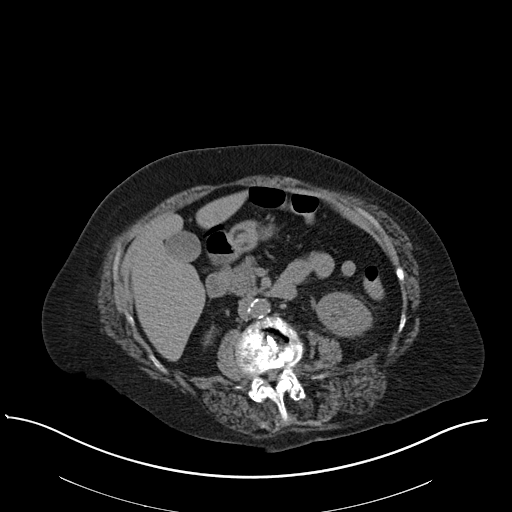
[im 55/85  soft-tissue]
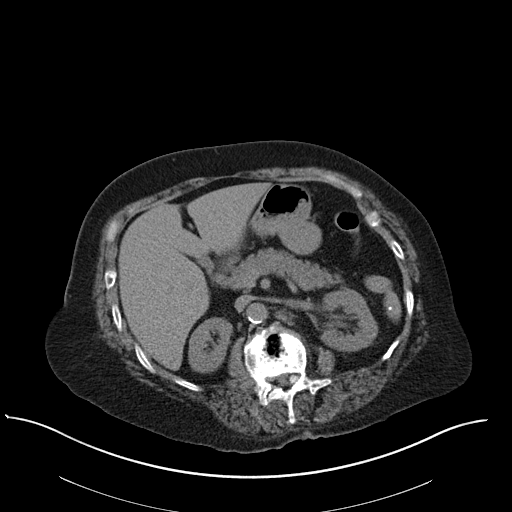
[im 55/85  bone]
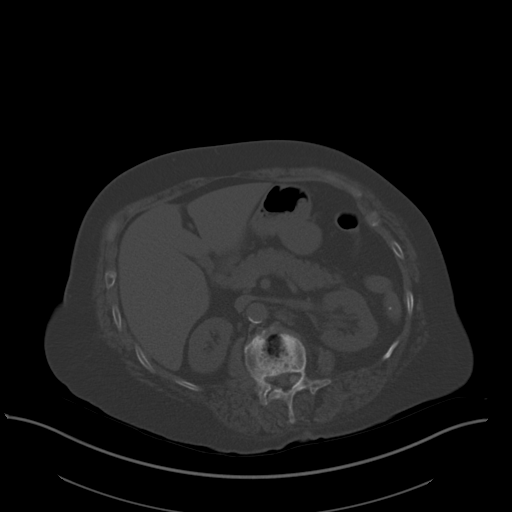
[im 60/85  soft-tissue]
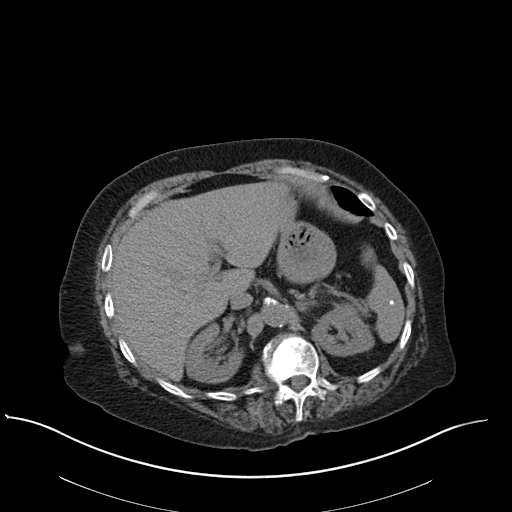
[im 65/85  soft-tissue]
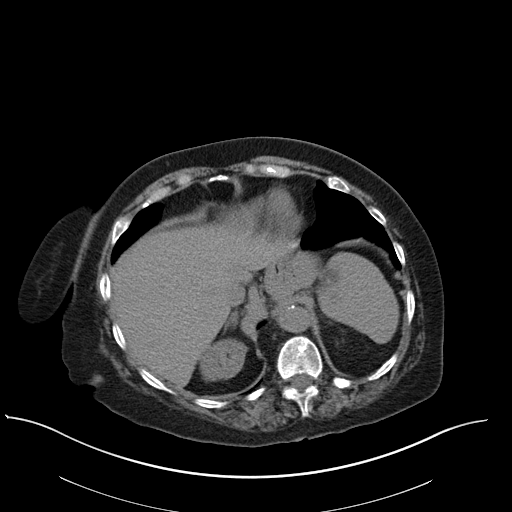
[im 75/85  soft-tissue]
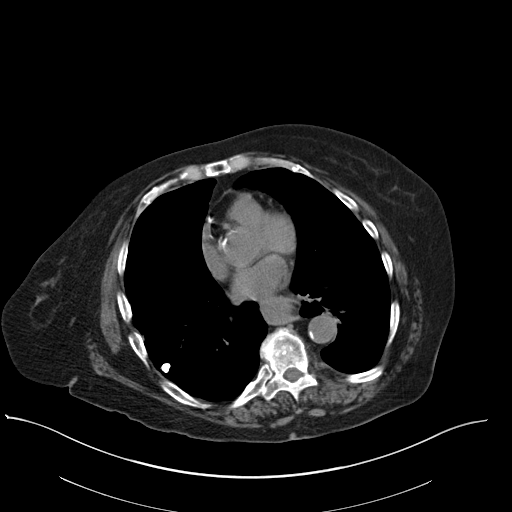
[im 80/85  soft-tissue]
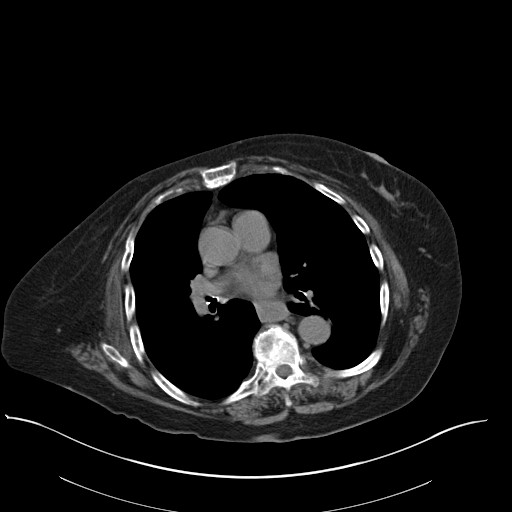

[Series 5: coronal st · coronal · 0.74mm/px · 3 of 153 slices shown]
[im 51/153  soft-tissue]
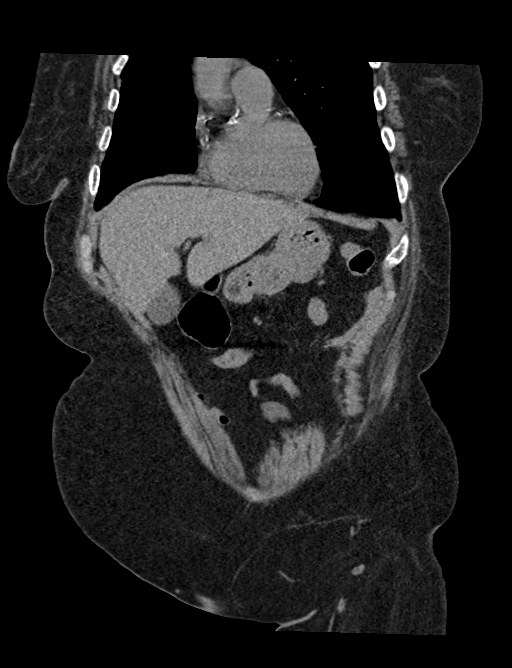
[im 68/153  soft-tissue]
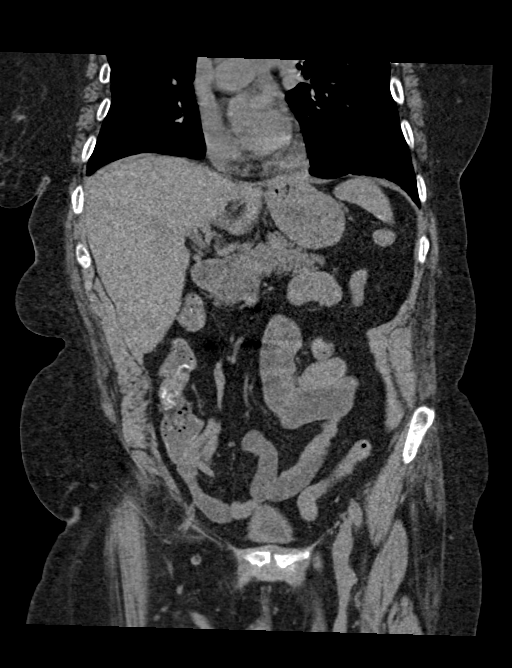
[im 85/153  soft-tissue]
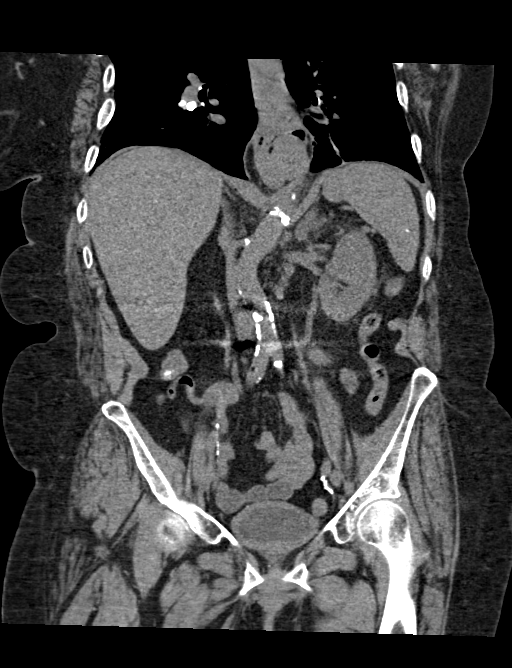

[16 of 46 positions shown; findings below may reference images not displayed]

FINDINGS: Lower chest: Moderate-sized hiatal hernia. Calcified granulomas in
the right lower lobe. Calcified right hilar lymph nodes. Peripheral
noncalcified right lower lobe nodule measures 2 cm on image 32.

Hepatobiliary: No focal hepatic abnormality. Gallbladder
unremarkable.

Pancreas: No focal abnormality or ductal dilatation.

Spleen: Calcifications throughout the spleen.  Normal size.

Adrenals/Urinary Tract: No adrenal abnormality. No focal renal
abnormality. No stones or hydronephrosis. Urinary bladder is
unremarkable.

Stomach/Bowel: Normal appendix. Stomach, large and small bowel
grossly unremarkable.

Vascular/Lymphatic: Aortoiliac atherosclerosis. No evidence of
aneurysm or adenopathy.

Reproductive: Prior hysterectomy.  No adnexal masses.

Other: No free fluid or free air.

Musculoskeletal: No acute bony abnormality. Postoperative and
degenerative changes in the lumbar spine.
IMPRESSION: No acute findings in the abdomen or pelvis.

Moderate-sized hiatal hernia.

Old granulomatous disease.

Smooth oval peripheral 2 cm nodule at the right lung base. This has
a benign appearance but is nonspecific. This could be followed with
repeat CT in 6 months to assess stability.

## 2021-08-27 IMAGING — DX DG CHEST 2V
2 series · 2 of 2 positions shown · non-contrast
Comparison: Chest x-ray dated August 08, 2020.

CLINICAL DATA: Nausea and vomiting with hematemesis.

EXAM:
CHEST - 2 VIEW

[chest pa]
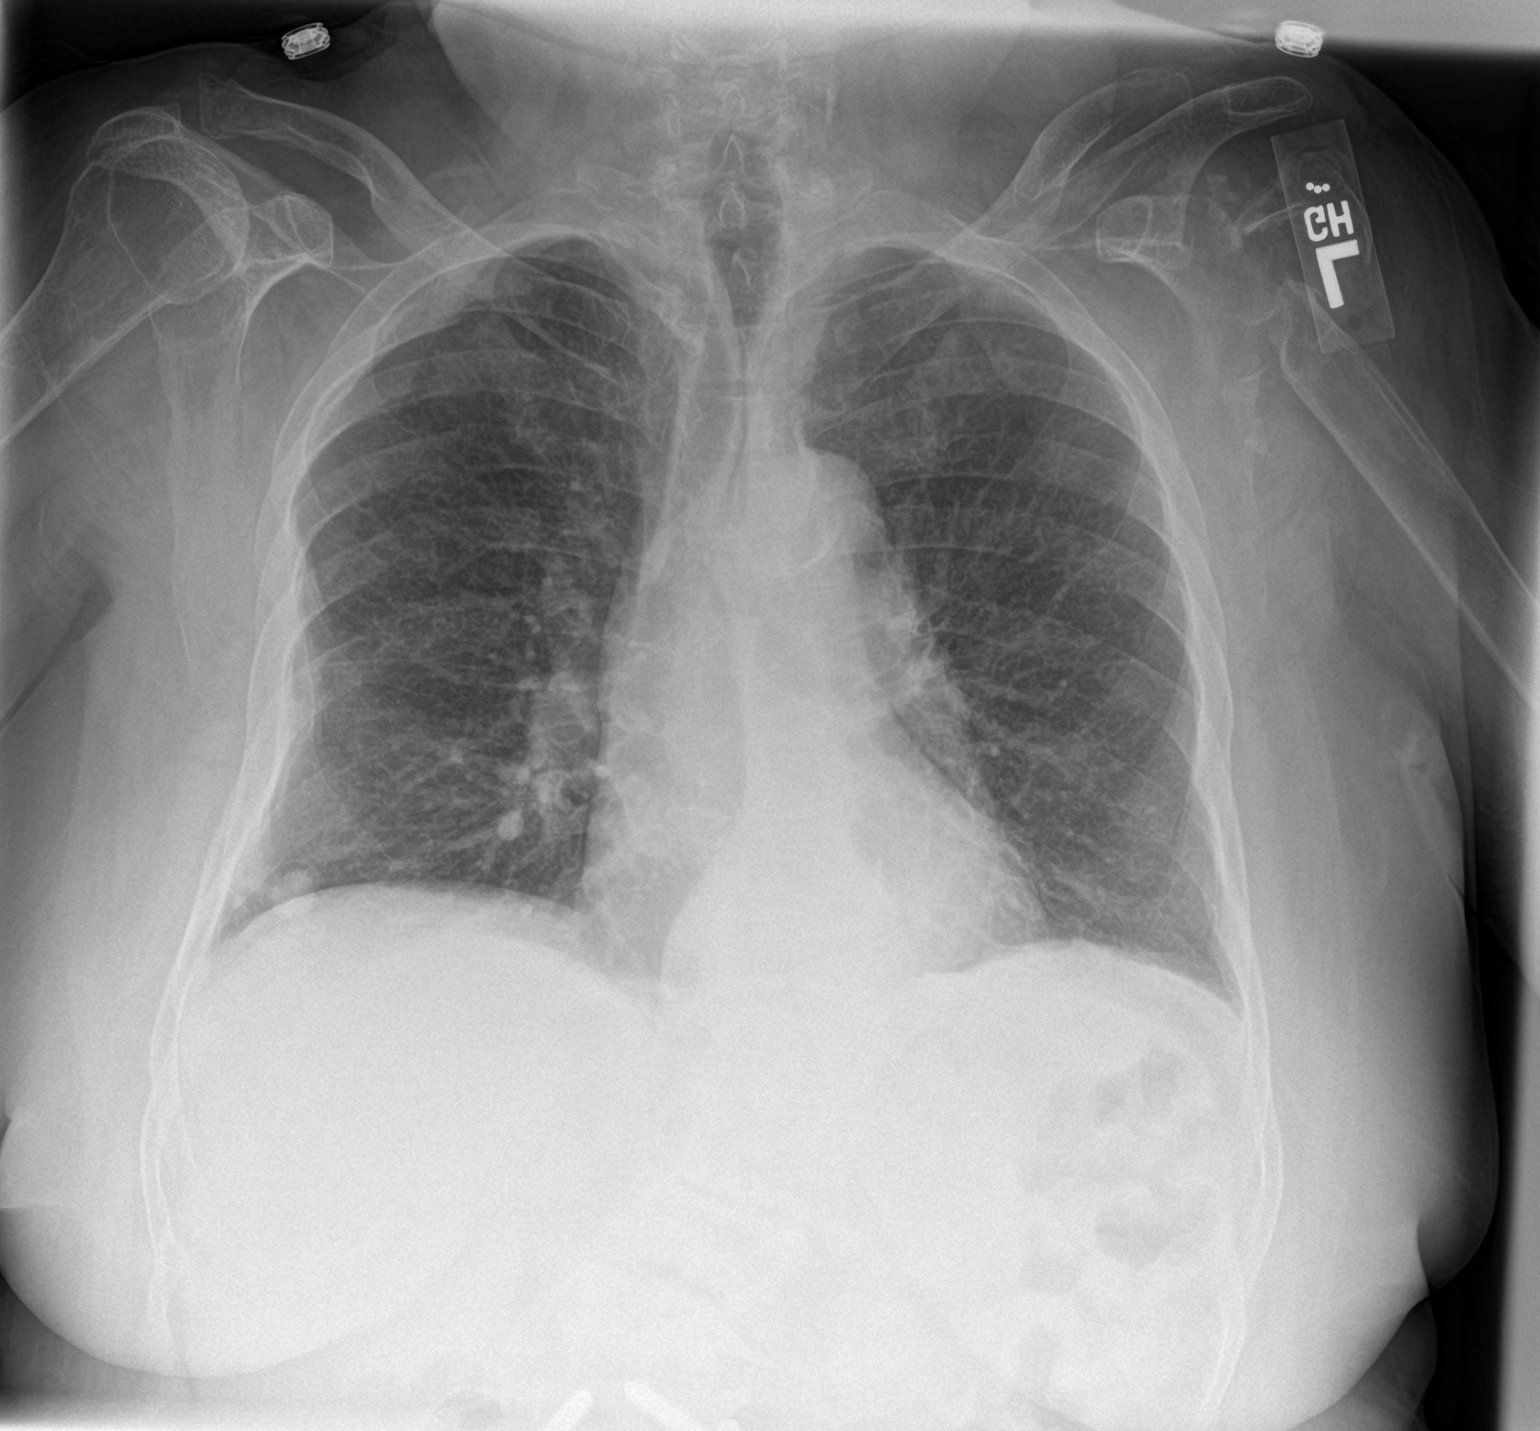

[chest lat]
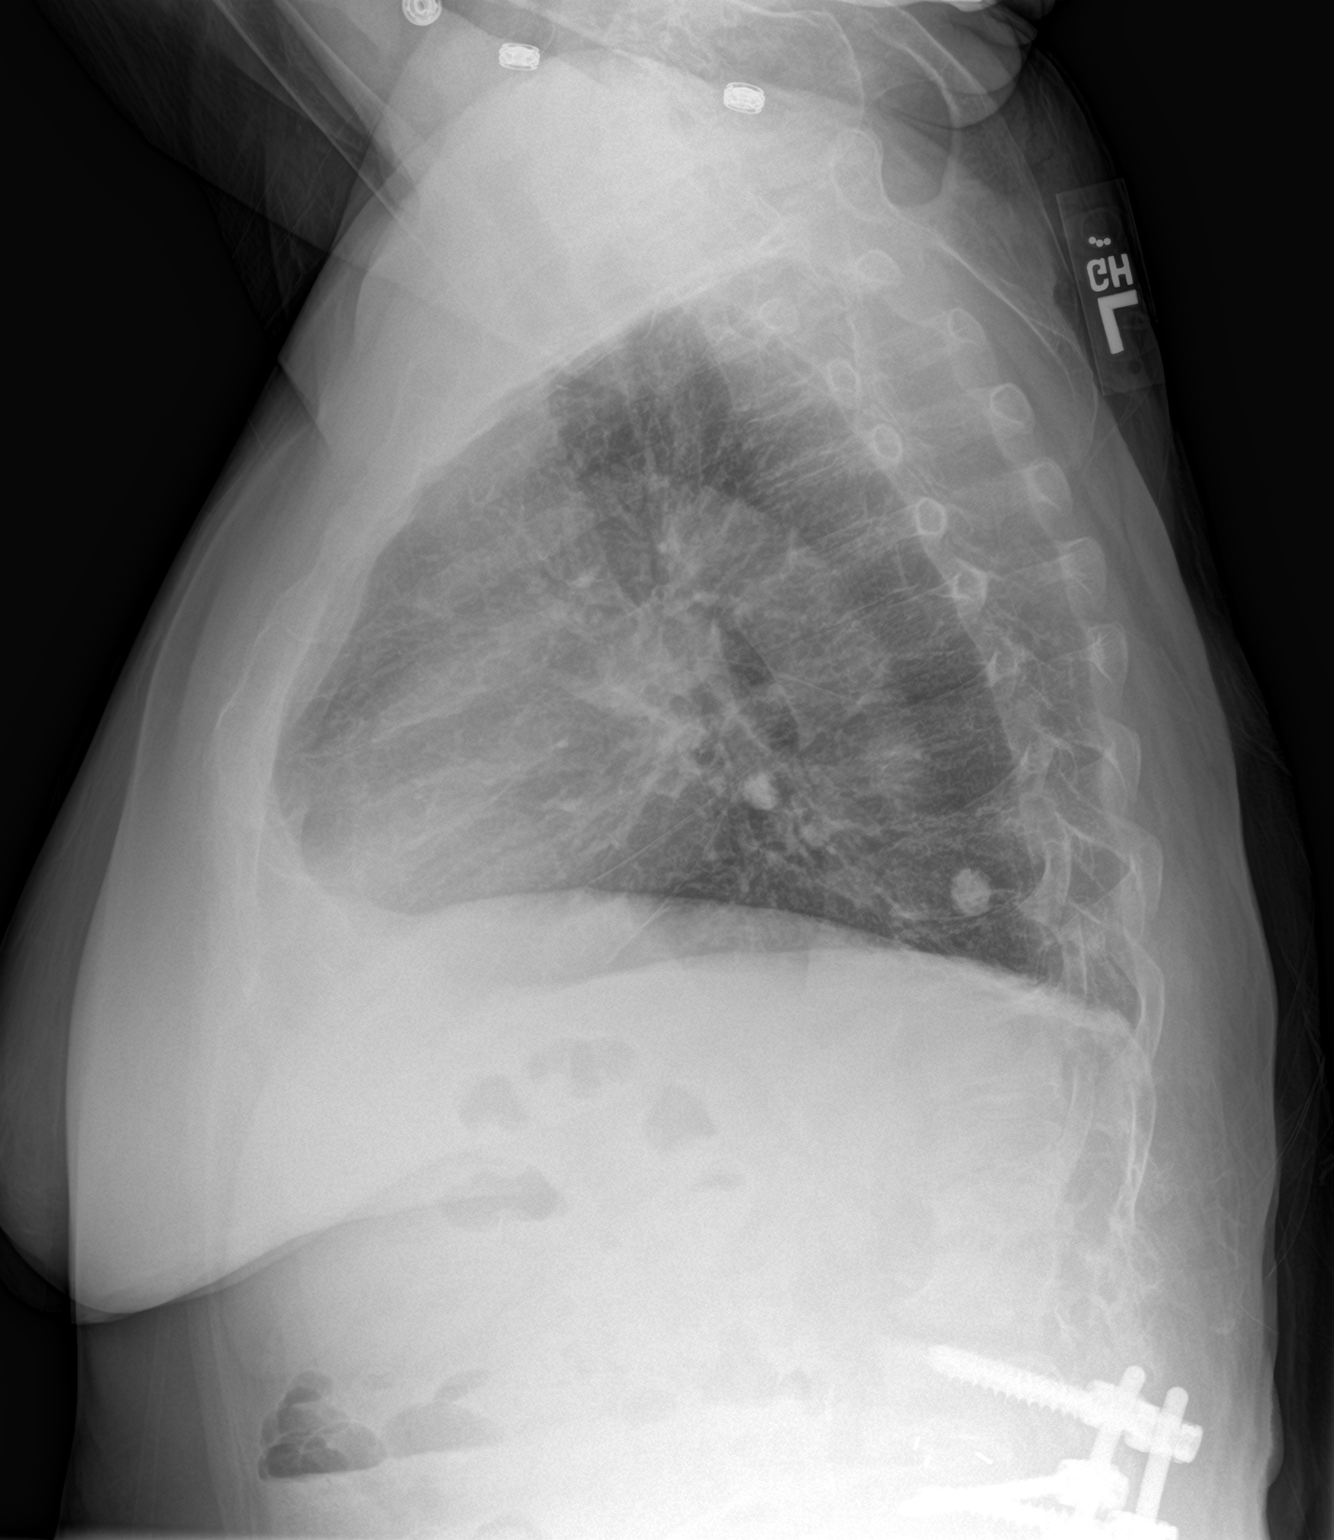

[2 of 2 positions shown; findings below may reference images not displayed]

FINDINGS: Normal heart size. Normal pulmonary vascularity. No focal
consolidation, pleural effusion, or pneumothorax. Unchanged
calcified granuloma at the peripheral right lung base. No acute
osseous abnormality. Unchanged deformity and bone loss of the left
proximal humerus.
IMPRESSION: 1. No acute cardiopulmonary disease.

## 2021-08-28 IMAGING — DX DG ABDOMEN 1V
1 series · 1 of 1 positions shown · non-contrast
Comparison: 08/09/2020

CLINICAL DATA: Abdominal pain.

EXAM:
ABDOMEN - 1 VIEW

[abdomen kub]
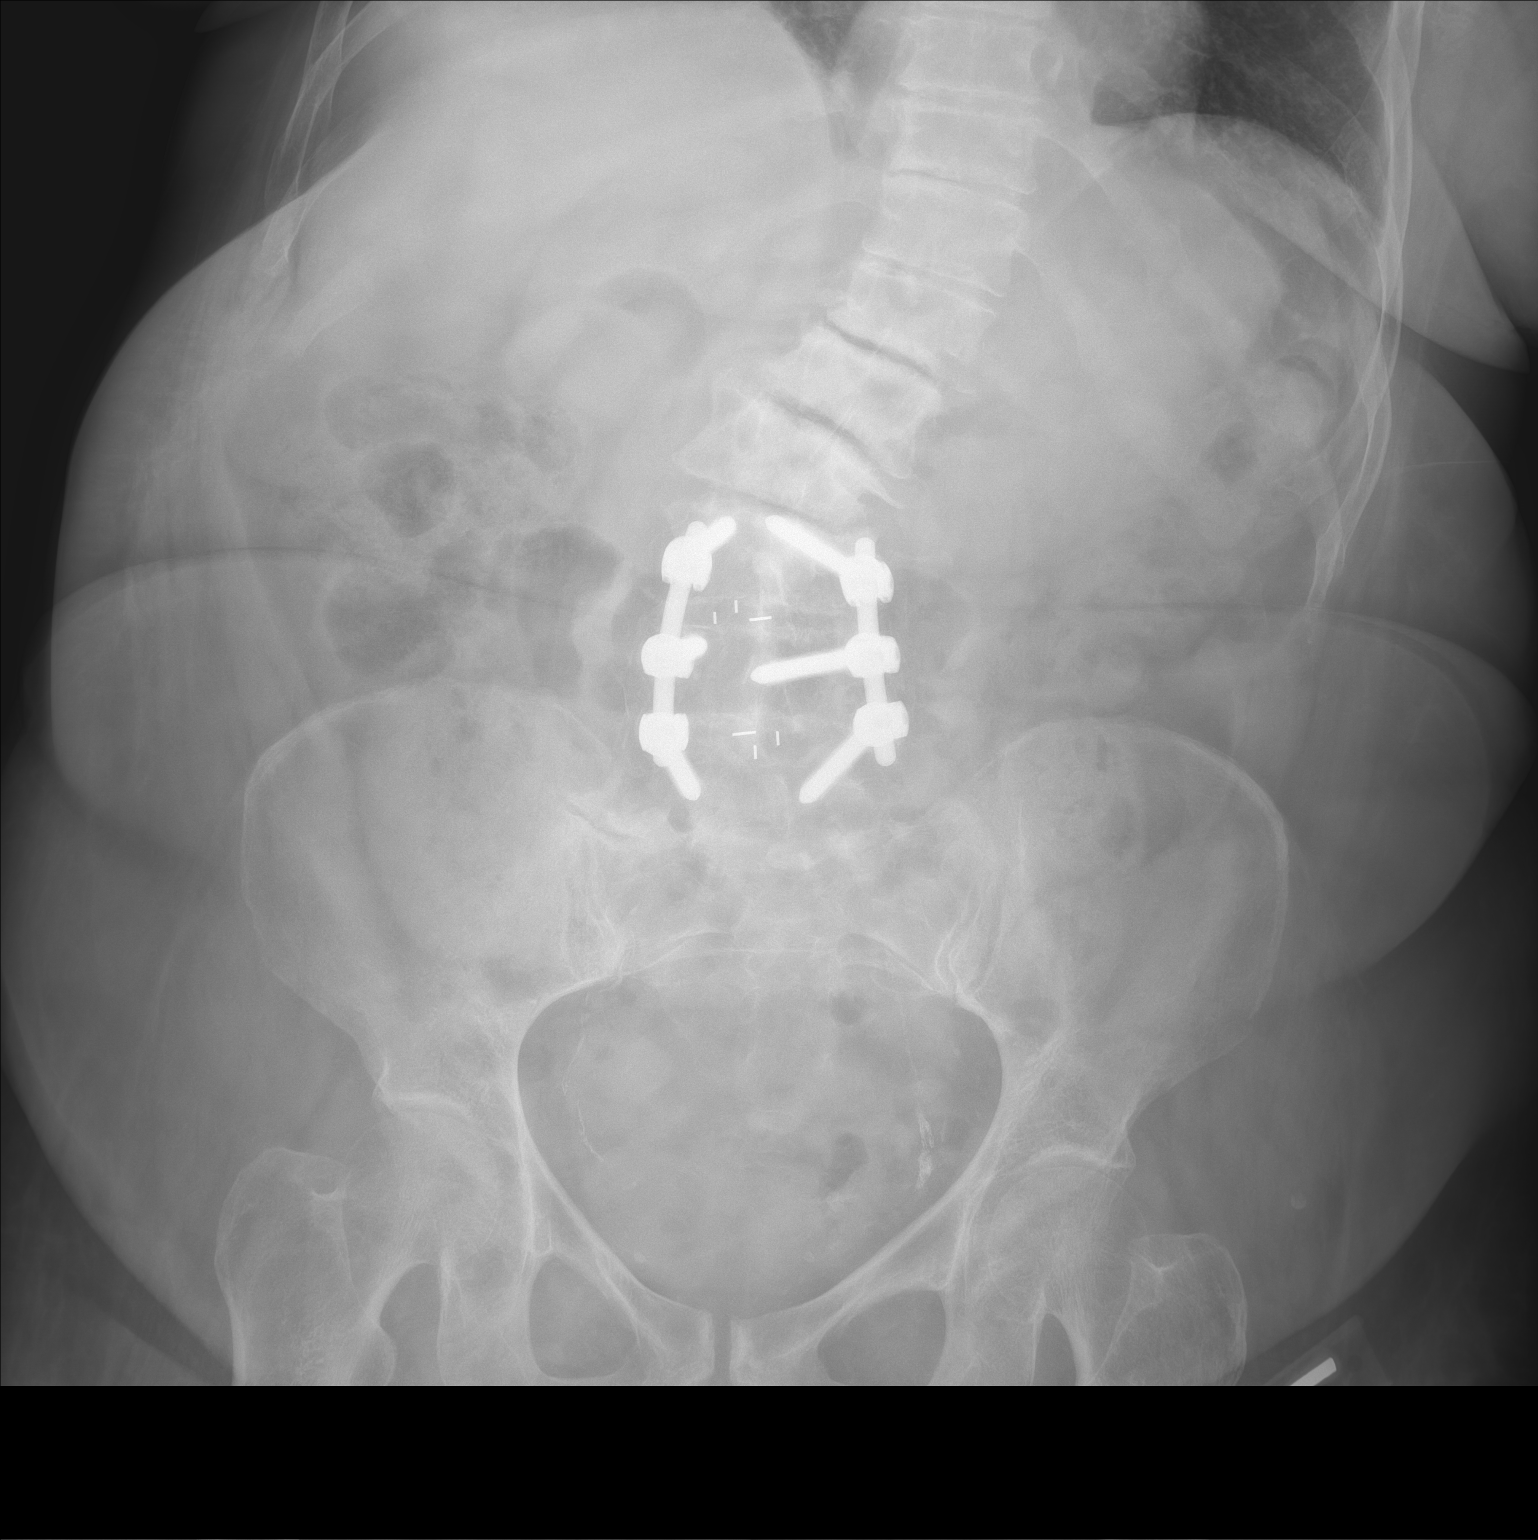

[1 of 1 positions shown; findings below may reference images not displayed]

FINDINGS: The abdominal bowel gas pattern appears stable and unremarkable. No
findings for obstruction or perforation. Stable lumbar fusion
hardware and severe lumbar scoliosis and degenerative disease.
IMPRESSION: Unremarkable abdominal bowel gas pattern.

## 2022-09-25 ENCOUNTER — Ambulatory Visit: Payer: Medicare PPO | Admitting: Orthopaedic Surgery

## 2022-09-25 ENCOUNTER — Other Ambulatory Visit: Payer: Self-pay | Admitting: Orthopaedic Surgery

## 2022-09-25 ENCOUNTER — Telehealth: Payer: Self-pay | Admitting: Orthopaedic Surgery

## 2022-09-25 ENCOUNTER — Encounter: Payer: Self-pay | Admitting: Orthopaedic Surgery

## 2022-09-25 VITALS — BP 125/82 | HR 116 | Ht <= 58 in | Wt 145.0 lb

## 2022-09-25 DIAGNOSIS — Z981 Arthrodesis status: Secondary | ICD-10-CM | POA: Diagnosis not present

## 2022-09-25 DIAGNOSIS — M4156 Other secondary scoliosis, lumbar region: Secondary | ICD-10-CM | POA: Diagnosis not present

## 2022-09-25 NOTE — Progress Notes (Signed)
Office Visit Note   Patient: Anne Carrillo           Date of Birth: Nov 25, 1955           MRN: [id number] Visit Date: 09/25/2022              Requested by: Burton Apley, MD [address] [place],  [place] [address] PCP: Burton Apley, MD   Assessment & Plan: Visit Diagnoses:  1. Other secondary scoliosis, lumbar region   2. History of lumbar fusion     Plan: Patient with severe adjacent level degenerative changes severe scoliosis she is only 4 foot 8-1/2.  She states she is to be 4 foot 11.  We discussed that she could not have instrumented fusion if she still smoking due to problems with bone healing, nonunion and repeat surgery or failure of hardware fixation with screw pullout etc.  She is unwilling to quit smoking.  She decides she wants to proceed with diagnostic imaging to discuss surgery she can quit smoking and then can make appointment and can follow-up with Dr. Christell Constant in our office.  Currently with her COPD and not sure that surgical correction would allow her to walk any further since she gets dyspnea with ambulation anyway.  If she is able to quit smoking and dyspnea improves then she can return for reevaluation.  Follow-Up Instructions: No follow-ups on file.   Orders:  No orders of the defined types were placed in this encounter.  No orders of the defined types were placed in this encounter.     Procedures: No procedures performed   Clinical Data: No additional findings.   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  She saw Dr. Loralie Champagne in [place].  Previous 2 levels lumbar instrumented fusion L3-4 L4-5 done in [place] many years ago.  She has had chronic back pain symptoms and has  severe scoliosis above the fusion with some lateral listhesis and lateral wedging of several vertebrae.  She has claudication with walking but also gets short of breath and has to stop sit due to her COPD.  Review of Systems all systems noncontributory to HPI.   Objective: Vital Signs: BP 125/82   Pulse (!) 116   Ht 4\' 11"  (1.499 m)   Wt 145 lb (65.8 kg)   BMI 29.29 kg/m   Physical Exam Constitutional:      Appearance: She is well-developed.  HENT:     Head: Normocephalic.     Right Ear: External ear normal.     Left Ear: External ear normal.  Eyes:     Pupils: Pupils are equal, round, and reactive to light.  Neck:     Thyroid: No thyromegaly.     Trachea: No tracheal deviation.  Cardiovascular:     Rate and Rhythm: Normal rate.  Pulmonary:     Effort: Pulmonary effort is normal.  Abdominal:     Palpations: Abdomen is soft.  Skin:    General: Skin is warm and dry.  Neurological:     Mental Status: She is alert and oriented to person, place, and time.  Psychiatric:        Behavior: Behavior normal.     Ortho Exam  patient has significant scoliosis with a left thoracolumbar curvature.  Healed lumbar incision.  Specialty Comments:  No specialty comments available.  Imaging: No results found.   PMFS History: Patient Active Problem List   Diagnosis Date Noted   Other secondary scoliosis, lumbar region 09/25/2022   History of lumbar fusion 09/25/2022   GI bleed 08/07/2020   Acute GI bleeding 08/05/2020   COPD (chronic obstructive pulmonary disease) (HCC) 08/05/2020   Lung nodule 08/05/2020   Chronic pain 08/05/2020   Grief reaction 11/26/2019   Sleep difficulties 11/26/2019   Edema 11/26/2019   Microcytic hypochromic anemia 10/27/2019   GERD (gastroesophageal reflux disease) 10/27/2019   Oropharyngeal dysphagia 10/27/2019   History of colonic polyps 10/27/2019   Tibia/fibula fracture, right, closed, with malunion, subsequent encounter 10/14/2019    Mechanical breakdown of internal orthopedic device (HCC) 04/01/2019   Past Medical History:  Diagnosis Date   Anemia    Arthritis    Carpal tunnel syndrome    Bilateral   COPD (chronic obstructive pulmonary disease) (HCC)    GERD (gastroesophageal reflux disease)    History of hiatal hernia    Hypertension    Migraines    Pneumonia    Tibia/fibula fracture    Right    Family History  Problem Relation Age of Onset   Heart attack Mother     Past Surgical History:  Procedure Laterality Date   ABDOMINAL HYSTERECTOMY     BACK SURGERY     Screws and plates lower back   CARPAL TUNNEL RELEASE Right    COLONOSCOPY     ECTOPIC PREGNANCY SURGERY  1986   ESOPHAGOGASTRODUODENOSCOPY (EGD) WITH PROPOFOL N/A 08/06/2020   Procedure: ESOPHAGOGASTRODUODENOSCOPY (EGD) WITH PROPOFOL;  Surgeon: Willis Modena, MD;  Location: [facility];  Service: Endoscopy;  Laterality: N/A;   ORIF TIBIA & FIBULA FRACTURES Right    TIBIA OSTEOTOMY Right 10/14/2019   Procedure: right tibia fibula osteotomy, takedown malunion, replating;  Surgeon: Eldred Manges, MD;  Location: [facility];  Service: Orthopedics;  Laterality: Right;   UPPER GI ENDOSCOPY     Social History   Occupational History   Not on file  Tobacco Use   Smoking status: Every Day    Current packs/day: 0.25    Average packs/day: 0.3 packs/day for 50.0 years (12.5 ttl pk-yrs)    Types: Cigarettes   Smokeless tobacco: Never   Tobacco comments:    At times she smoked as much as 2-1/2-3 packs/day.  Vaping Use   Vaping status: Never Used  Substance and Sexual Activity   Alcohol use: Yes   Drug use: Never   Sexual activity: Not on file

## 2022-09-25 NOTE — Telephone Encounter (Signed)
Patient called. Would like muscle relaxer and pain medication sent to [facility] - [place], [place] - [address] [[id number]]

## 2022-09-25 NOTE — Telephone Encounter (Signed)
noted 

## 2023-08-13 ENCOUNTER — Ambulatory Visit: Admitting: Physician Assistant

## 2023-08-13 ENCOUNTER — Other Ambulatory Visit (INDEPENDENT_AMBULATORY_CARE_PROVIDER_SITE_OTHER): Payer: Self-pay

## 2023-08-13 ENCOUNTER — Encounter: Payer: Self-pay | Admitting: Physician Assistant

## 2023-08-13 DIAGNOSIS — M25561 Pain in right knee: Secondary | ICD-10-CM | POA: Insufficient documentation

## 2023-08-13 DIAGNOSIS — G8929 Other chronic pain: Secondary | ICD-10-CM

## 2023-08-13 NOTE — Progress Notes (Signed)
 Office Visit Note   Patient: Anne Carrillo           Date of Birth: 1955-06-26           MRN: [id number] Visit Date: 08/13/2023              Requested by: Dudley Ghee, MD [address] [place],  [place] [address] PCP: Dudley Ghee, MD   Assessment & Plan: Visit Diagnoses:  1. Chronic pain of right knee     Plan: Patient is a 68 year old woman who is a former patient of Dr. Murrel Arnt.  She is status post IM rodding of tib-fib fracture many years ago.  This was revised to a plate by Dr. Murrel Arnt she did not have any infection.  She has been having some knee plaint pain has spasms occasionally denies fever chills or calf pain.  She does take OxyContin  for pain which she gets from Dr. Adelene Homer.  I do not see anything concerning.  Her calves are soft nontender negative Homans' sign no sign of infection.  X-rays look okay.  She does have some arthritis as would be expected in the medial joint of the right knee.  It is not really bothering her much today she does not have an effusion so I would not necessarily do an injection.  I did offer her some physical therapy which she declined.  She may follow-up as needed  Follow-Up Instructions: No follow-ups on file.   Orders:  Orders Placed This Encounter  Procedures   XR Knee 1-2 Views Right   No orders of the defined types were placed in this encounter.     Procedures: No procedures performed   Clinical Data: No additional findings.   Subjective: No chief complaint on file.   HPI pleasant 68 year old woman comes in today complaining of knots on her lower legs she is status post ORIF tibial plateau fracture and tibia fracture revised with Dr. Murrel Arnt.  No hip history of fever chills no history of infection she says her knee sometimes pops  Review of Systems  All other systems reviewed and are negative.    Objective: Vital Signs: There were no vitals taken for this visit.  Physical Exam Constitutional:      Appearance: Normal  appearance.  Pulmonary:     Effort: Pulmonary effort is normal.  Skin:    General: Skin is warm and dry.  Neurological:     General: No focal deficit present.     Mental Status: She is alert and oriented to person, place, and time.     Ortho Exam Examination of her right knee she her calves are soft and nontender well-healed surgical incision no cellulitis no erythema she has active extension and flexion of her knee is a lot moderately stiff.  No tenderness over the joint line.  She is neurovascular intact she does have some varicosities on the lower leg but not tender no areas of fluctuance Specialty Comments:  No specialty comments available.  Imaging: No results found.   PMFS History: Patient Active Problem List   Diagnosis Date Noted   Other secondary scoliosis, lumbar region 09/25/2022   History of lumbar fusion 09/25/2022   GI bleed 08/07/2020   Acute GI bleeding 08/05/2020   COPD (chronic obstructive pulmonary disease) (HCC) 08/05/2020   Lung nodule 08/05/2020   Chronic pain 08/05/2020   Grief reaction 11/26/2019   Sleep difficulties 11/26/2019   Edema 11/26/2019   Microcytic hypochromic anemia 10/27/2019   GERD (gastroesophageal reflux  disease) 10/27/2019   Oropharyngeal dysphagia 10/27/2019   History of colonic polyps 10/27/2019   Tibia/fibula fracture, right, closed, with malunion, subsequent encounter 10/14/2019   Mechanical breakdown of internal orthopedic device (HCC) 04/01/2019   Past Medical History:  Diagnosis Date   Anemia    Arthritis    Carpal tunnel syndrome    Bilateral   COPD (chronic obstructive pulmonary disease) (HCC)    GERD (gastroesophageal reflux disease)    History of hiatal hernia    Hypertension    Migraines    Pneumonia    Tibia/fibula fracture    Right    Family History  Problem Relation Age of Onset   Heart attack Mother     Past Surgical History:  Procedure Laterality Date   ABDOMINAL HYSTERECTOMY     BACK SURGERY      Screws and plates lower back   CARPAL TUNNEL RELEASE Right    COLONOSCOPY     ECTOPIC PREGNANCY SURGERY  1986   ESOPHAGOGASTRODUODENOSCOPY (EGD) WITH PROPOFOL  N/A 08/06/2020   Procedure: ESOPHAGOGASTRODUODENOSCOPY (EGD) WITH PROPOFOL ;  Surgeon: Evangeline Hilts, MD;  Location: [facility];  Service: Endoscopy;  Laterality: N/A;   ORIF TIBIA & FIBULA FRACTURES Right    TIBIA OSTEOTOMY Right 10/14/2019   Procedure: right tibia fibula osteotomy, takedown malunion, replating;  Surgeon: Adah Acron, MD;  Location: [facility];  Service: Orthopedics;  Laterality: Right;   UPPER GI ENDOSCOPY     Social History   Occupational History   Not on file  Tobacco Use   Smoking status: Every Day    Current packs/day: 0.25    Average packs/day: 0.3 packs/day for 50.0 years (12.5 ttl pk-yrs)    Types: Cigarettes   Smokeless tobacco: Never   Tobacco comments:    At times she smoked as much as 2-1/2-3 packs/day.  Vaping Use   Vaping status: Never Used  Substance and Sexual Activity   Alcohol use: Yes   Drug use: Never   Sexual activity: Not on file
